# Patient Record
Sex: Female | Born: 1960 | Race: White | Hispanic: No | Marital: Married
Health system: Southern US, Community
[De-identification: ages and names within clinical notes are randomized; demographics above are authoritative.]

## PROBLEM LIST (undated history)

## (undated) DIAGNOSIS — Z8679 Personal history of other diseases of the circulatory system: Secondary | ICD-10-CM

## (undated) DIAGNOSIS — K648 Other hemorrhoids: Secondary | ICD-10-CM

## (undated) DIAGNOSIS — Z9889 Other specified postprocedural states: Secondary | ICD-10-CM

## (undated) DIAGNOSIS — F419 Anxiety disorder, unspecified: Secondary | ICD-10-CM

## (undated) DIAGNOSIS — K219 Gastro-esophageal reflux disease without esophagitis: Secondary | ICD-10-CM

## (undated) DIAGNOSIS — J189 Pneumonia, unspecified organism: Secondary | ICD-10-CM

## (undated) DIAGNOSIS — K5909 Other constipation: Secondary | ICD-10-CM

## (undated) DIAGNOSIS — E162 Hypoglycemia, unspecified: Secondary | ICD-10-CM

## (undated) HISTORY — DX: Gastro-esophageal reflux disease without esophagitis: K21.9

## (undated) HISTORY — DX: Other constipation: K59.09

## (undated) HISTORY — DX: Anxiety disorder, unspecified: F41.9

## (undated) HISTORY — DX: Hypoglycemia, unspecified: E16.2

## (undated) HISTORY — DX: Pneumonia, unspecified organism: J18.9

## (undated) HISTORY — DX: Other hemorrhoids: K64.8

## (undated) HISTORY — PX: KNEE CARTILAGE SURGERY: SHX688

## (undated) HISTORY — DX: Personal history of other diseases of the circulatory system: Z86.79

## (undated) HISTORY — DX: Other specified postprocedural states: Z98.890

---

## 1986-12-25 HISTORY — PX: TUBAL LIGATION: SHX77

## 2000-01-19 ENCOUNTER — Encounter: Payer: Self-pay | Admitting: Family Medicine

## 2000-01-19 ENCOUNTER — Encounter: Admission: RE | Admit: 2000-01-19 | Discharge: 2000-01-19 | Payer: Self-pay | Admitting: Family Medicine

## 2001-04-30 ENCOUNTER — Encounter: Payer: Self-pay | Admitting: Family Medicine

## 2001-04-30 ENCOUNTER — Encounter: Admission: RE | Admit: 2001-04-30 | Discharge: 2001-04-30 | Payer: Self-pay | Admitting: Family Medicine

## 2001-12-25 HISTORY — PX: VAGINAL HYSTERECTOMY: SUR661

## 2002-05-05 ENCOUNTER — Encounter: Admission: RE | Admit: 2002-05-05 | Discharge: 2002-05-05 | Payer: Self-pay | Admitting: Family Medicine

## 2002-05-05 ENCOUNTER — Encounter: Payer: Self-pay | Admitting: Family Medicine

## 2002-06-02 ENCOUNTER — Other Ambulatory Visit: Admission: RE | Admit: 2002-06-02 | Discharge: 2002-06-02 | Payer: Self-pay | Admitting: Obstetrics & Gynecology

## 2002-08-06 ENCOUNTER — Encounter (INDEPENDENT_AMBULATORY_CARE_PROVIDER_SITE_OTHER): Payer: Self-pay | Admitting: Specialist

## 2002-08-06 ENCOUNTER — Observation Stay (HOSPITAL_COMMUNITY): Admission: RE | Admit: 2002-08-06 | Discharge: 2002-08-07 | Payer: Self-pay | Admitting: Obstetrics & Gynecology

## 2003-07-24 ENCOUNTER — Encounter: Admission: RE | Admit: 2003-07-24 | Discharge: 2003-07-24 | Payer: Self-pay | Admitting: Obstetrics & Gynecology

## 2003-07-24 ENCOUNTER — Encounter: Payer: Self-pay | Admitting: Obstetrics & Gynecology

## 2004-05-10 ENCOUNTER — Other Ambulatory Visit: Admission: RE | Admit: 2004-05-10 | Discharge: 2004-05-10 | Payer: Self-pay | Admitting: Obstetrics & Gynecology

## 2004-08-23 ENCOUNTER — Encounter: Admission: RE | Admit: 2004-08-23 | Discharge: 2004-08-23 | Payer: Self-pay | Admitting: Obstetrics & Gynecology

## 2004-09-28 ENCOUNTER — Encounter: Payer: Self-pay | Admitting: Gastroenterology

## 2004-10-04 ENCOUNTER — Encounter: Payer: Self-pay | Admitting: Gastroenterology

## 2005-12-25 HISTORY — PX: ENDOVENOUS ABLATION SAPHENOUS VEIN W/ LASER: SUR449

## 2006-03-02 ENCOUNTER — Encounter: Admission: RE | Admit: 2006-03-02 | Discharge: 2006-03-02 | Payer: Self-pay | Admitting: Obstetrics & Gynecology

## 2010-04-02 ENCOUNTER — Emergency Department (HOSPITAL_COMMUNITY): Admission: EM | Admit: 2010-04-02 | Discharge: 2010-04-02 | Payer: Self-pay | Admitting: Emergency Medicine

## 2010-08-30 ENCOUNTER — Encounter: Payer: Self-pay | Admitting: Gastroenterology

## 2010-09-02 ENCOUNTER — Encounter: Payer: Self-pay | Admitting: Gastroenterology

## 2010-11-22 ENCOUNTER — Telehealth: Payer: Self-pay | Admitting: Gastroenterology

## 2010-11-23 ENCOUNTER — Ambulatory Visit: Payer: Self-pay | Admitting: Gastroenterology

## 2010-11-23 DIAGNOSIS — K5900 Constipation, unspecified: Secondary | ICD-10-CM | POA: Insufficient documentation

## 2010-11-29 ENCOUNTER — Ambulatory Visit: Payer: Self-pay | Admitting: Gastroenterology

## 2010-11-29 LAB — CONVERTED CEMR LAB: Fecal Occult Bld: NEGATIVE

## 2011-01-14 ENCOUNTER — Encounter: Payer: Self-pay | Admitting: Obstetrics & Gynecology

## 2011-01-24 NOTE — Letter (Signed)
Summary: [facility]: Immunoassay Fecal Occult Blood (iFOB) Order [facility]  [address] [place], [place] [address]   Phone: [phone]  Fax: [phone]      [facility]: Immunoassay Fecal Occult Blood (iFOB) Order Form   November 23, 2010 MRN: [id number]   [name] 11/27/1961   Physicican Name: Dr. Claudette Head  Diagnosis Code: 564.00      Christie Nottingham CMA (AAMA)

## 2011-01-24 NOTE — Consult Note (Signed)
Summary: [facility]   Imported By: Sherian Rein 11/24/2010 10:13:31  _____________________________________________________________________  External Attachment:    Type:   Image     Comment:   External Document

## 2011-01-24 NOTE — Progress Notes (Signed)
Summary: Triage  Phone Note Call from Patient Call back at Home Phone [phone]   Caller: Patient Call For: Dr. Russella Dar Reason for Call: Talk to Nurse Summary of Call: Pt has not been here since last colon several years ago but recently she has developed serious constipation issues, she has consulted with primary phy and none of their suggestions seem to help, went off of hormone replacement therapy to see if that would give her relief but nothing seems to help, she is very uncomfortable and woud like to be seen soon if possible Initial call taken by: Raechel Chute,  November 22, 2010 10:58 AM  Follow-up for Phone Call        Patient scheduled to see Dr. Russella Dar 11/23/10 at 10:30am. Patient instructed to call her primary care md, McKenzie, to have records faxed to [phone] attention [facility]. Selinda Michaels, RN Follow-up by: Darcey Nora RN, CGRN,  November 22, 2010 11:11 AM

## 2011-01-24 NOTE — Procedures (Signed)
Summary: Colonoscopy   Colonoscopy  Procedure date:  10/04/2004  Findings:      Location:  [facility].    Procedures Next Due Date:    Colonoscopy: 09/2011 Patient Name: [name], Sarah J. MRN:  Procedure Procedures: Colonoscopy CPT: [phone].  Personnel: Endoscopist: Venita Lick. Russella Dar, MD, Clementeen Graham.  Exam Location: Exam performed in Outpatient Clinic. Outpatient  Patient Consent: Procedure, Alternatives, Risks and Benefits discussed, consent obtained, from patient. Consent was obtained by the RN.  Indications Symptoms: Constipation Hematochezia.  History  Current Medications: Patient is not currently taking Coumadin.  Pre-Exam Physical: Performed Oct 04, 2004. Entire physical exam was normal.  Exam Exam: Extent of exam reached: Cecum, extent intended: Cecum.  The cecum was identified by appendiceal orifice and IC valve. Colon retroflexion performed. ASA Classification: II. Tolerance: excellent.  Monitoring: Pulse and BP monitoring, Oximetry used. Supplemental O2 given.  Colon Prep Used Miralax for colon prep. Prep results: good.  Sedation Meds: Patient assessed and found to be appropriate for moderate (conscious) sedation. Fentanyl 175 mcg. given IV. Versed 17 given IV.  Findings NORMAL EXAM: Cecum to Sigmoid Colon.  HEMORRHOIDS: Internal. Size: Small. Not bleeding. Not thrombosed. ICD9: Hemorrhoids, Internal: 455.0.    Comments: Tortuous colon-moderately difficult procedure Assessment  Diagnoses: 455.0: Hemorrhoids, Internal.   Events  Unplanned Interventions: No intervention was required.  Unplanned Events: There were no complications. Plans Medication Plan: Continue current medications. Hemorrhoidal Medications: Anusol Cream  prn,  Hemorrhoidal Medications: Anusol Suppositories  prn,  Anti-constipation: Miralax BID,   Patient Education: Patient given standard instructions for: Diverticulosis. Hemorrhoids.  Disposition: After procedure  patient sent to recovery. After recovery patient sent home.  Scheduling/Referral: Colonoscopy, to [facility] T. Russella Dar, MD, [facility], around Oct 05, 2011.  Office Visit, to [employer]. Russella Dar, MD, [facility], prn   This report was created from the original endoscopy report, which was reviewed and signed by the above listed endoscopist.

## 2011-01-24 NOTE — Assessment & Plan Note (Signed)
Summary: Constipation/[facility]   History of Present Illness Visit Type: Initial Visit Primary GI MD: Elie Goody MD [facility] Primary Provider: Shary Decamp, MD Chief Complaint: constipation x 6 months History of Present Illness:   This is a 50 year old female, who relates worsening problems with chronic constipation. She underwent colonoscopy in November 2005 for constipation and hematochezia, which revealed a tortuous colon, and internal hemorrhoids. She states in the past year she has had a worsening of her constipation and notes rare episodes of small amounts of rectal bleeding when she strains significantly to have a bowel movement. She uses Ducolax uppositories, MiraLax, enemas and magnesium citrate intermittently for management of her symptoms. She notes bloating and discomfort in her lower abdomen when she has had several days in between bowel movements.   GI Review of Systems    Reports abdominal pain, bloating, and  weight gain.     Location of  Abdominal pain: lower abdomen.    Denies acid reflux, belching, chest pain, dysphagia with liquids, dysphagia with solids, heartburn, loss of appetite, nausea, vomiting, vomiting blood, and  weight loss.      Reports constipation, hemorrhoids, and  rectal bleeding.     Denies anal fissure, black tarry stools, change in bowel habit, diarrhea, diverticulosis, fecal incontinence, heme positive stool, irritable bowel syndrome, jaundice, light color stool, liver problems, and  rectal pain.   Current Medications (verified): 1)  Vivelle-Dot 0.0375 Mg/24hr Pttw (Estradiol) .... As Directed 2)  Ativan 1 Mg Tabs (Lorazepam) .... At Bedtime 3)  Caltrate 600 1500 Mg Tabs (Calcium Carbonate) .... Take 2 Tablets By Mouth Once Daily  Allergies: 1)  ! Sulfa 2)  ! Macrobid 3)  ! Penicillin 4)  ! Codeine 5)  ! Sudafed  Past History:  Past Medical History: Reviewed history from 11/22/2010 and no changes required. 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  Illicit Drug Use - no Alcohol Use - no  Review of Systems       The patient complains of arthritis/joint pain, swelling of feet/legs, thirst - excessive, and urination changes/pain.         The pertinent positives and negatives are noted as above and in the HPI. All other ROS were reviewed and were negative.   Vital Signs:  Patient profile:   50 year old female Height:      66 inches Weight:      140.38 pounds BMI:     22.74 Pulse rate:   68 / minute Pulse rhythm:   regular BP sitting:   100 / 80  (left arm) Cuff size:   regular  Vitals Entered By: June McMurray CMA Duncan Dull) (November 23, 2010 10:34 AM)  Physical Exam  General:  Well developed, well nourished, no acute distress. Head:  Normocephalic and atraumatic. Eyes:  PERRLA, no icterus. Ears:  Normal auditory acuity. Mouth:  No deformity or lesions, dentition normal. Neck:  Supple; no masses or thyromegaly. Lungs:  Clear throughout to auscultation. Heart:  Regular rate and rhythm; no murmurs, rubs,  or bruits. Abdomen:  Soft, nontender and nondistended. No masses, hepatosplenomegaly or hernias noted. Normal bowel sounds. Rectal:  Normal exam. hemocult negative.   Msk:  Symmetrical with no gross deformities. Normal posture. Pulses:  Normal pulses noted. Extremities:  No clubbing, cyanosis, edema or deformities noted. Neurologic:  Alert and  oriented x4;  grossly normal neurologically. Cervical Nodes:  No significant cervical adenopathy. Inguinal Nodes:  No significant inguinal adenopathy. Psych:  Alert and cooperative. Normal mood and  affect.   Impression & Recommendations:  Problem # 1:  CONSTIPATION (ICD-564.00) Chronic constipation, which has worsened over the past year. Advised to increase her daily fiber and water intake. Increase MiraLax to 2 or 3 times daily. Avoid enemas. If her symptoms do not respond to the above measures, home stool occult blood testing is positive or she has persistent problems with hematochezia, we will schedule colonoscopy to further evaluate.  Patient Instructions: 1)  Please go directly to the basement to have your labs drawn.  2)  Start Miralax 17 grams into 8 oz of water 2-3 x daily. 3)  Start Align one capsule by mouth once daily x 1 month and this medication does not help with symptoms then stop.  4)  High Fiber, Low Fat  Healthy Eating Plan brochure given.  5)  Copy sent to : Shary Decamp, MD 6)  The medication list was reviewed and reconciled.  All changed / newly prescribed medications were explained.  A complete medication list was provided to the patient / caregiver.

## 2011-01-27 NOTE — Letter (Signed)
Summary: [facility]   Imported By: [place] 11/28/2010 09:58:17  _____________________________________________________________________  External Attachment:    Type:   Image     Comment:   External Document

## 2011-05-12 NOTE — Op Note (Signed)
NAME:  [name], [name]                           ACCOUNT NO.:  [id number]   MEDICAL RECORD NO.:  [id number]                   PATIENT TYPE:  OBV   LOCATION:  9399                                 FACILITY:  [facility]   PHYSICIAN:  Freddy Finner, M.D.                DATE OF BIRTH:  11/30/1961   DATE OF PROCEDURE:  08/06/2002  DATE OF DISCHARGE:                                 OPERATIVE REPORT   PREOPERATIVE DIAGNOSES:  Fibroids, menorrhagia.   POSTOPERATIVE DIAGNOSES:  Fibroids, menorrhagia.   OPERATIVE PROCEDURE:  Total vaginal hysterectomy.   SURGEON:  Freddy Finner, M.D.   ASSISTANT:  Guy Sandifer. Henderson Cloud, M.D.   ESTIMATED INTRAOPERATIVE BLOOD LOSS:  50 cc.   ANESTHESIA:  General endotracheal.   INTRAOPERATIVE COMPLICATIONS:  None.   INDICATION:  Details of the present illness are recorded in the admission  note.   DESCRIPTION OF PROCEDURE:  The patient was admitted on the morning of  surgery, given a bolus of Cefotan IV, placed in PAS hose, brought to the  operating room, placed under adequate general endotracheal anesthesia,  placed in the dorsal lithotomy position using the Allen stirrup system.  Betadine prep using scrub followed by solution was carried out of lower  abdomen, perineum and vagina.  Sterile drapes were applied.  A posterior  weighted vaginal retractor was placed.  Cervix was grasped with a Christella Hartigan  tenaculum.  Colpotomy incision was made while tenting the mucosa posterior  to the cervix.  Cervix was circumscribed with a scalpel.  Curved Heaneys  were then used to cross-clamp the uterosacral pedicles, which were divided  sharply and ligated with 0 Monocryl in a Heaney fashion.  Bladder was  advanced off the cervix.  Bladder pillars were taken with curved Heaneys,  divided sharply and ligated with 0 Monocryl.  Cardinal ligament pedicles  were taken, divided sharply and ligated with 0 Monocryl.  Anterior  peritoneum was entered.  Vascular pedicle was taken on each  side, divided  sharply and ligated with 0 Monocryl.  A second pedicle was taken above the  vessels on each side; this too was divided sharply and ligated with 0  Monocryl.  Uterus was delivered through the vaginal introitus.  Uterine  artery pedicles were cross-clamped with curved Heaneys, divided sharply and  ligated with free ties of 0 Monocryl, followed by suture ligature of 0  Monocryl.  Careful inspection revealed normal ovaries and tubes.  One Falope  ring was removed; the other was not seen.  These pedicles were released.  Angles of the vagina were anchored to the uterosacrals with mattress sutures  of  0 Monocryl.  Uterosacrals were plicated and posterior peritoneum closed with  0 Monocryl.  Cuff was closed vertically with figure-of-eights of 0 Monocryl.  Hemostasis was complete.  Foley catheter was placed.  Patient was awakened  and taken to recovery in good condition.  Freddy Finner, M.D.    WRN/MEDQ  D:  08/06/2002  T:  08/06/2002  Job:  [id number]

## 2011-05-12 NOTE — H&P (Signed)
NAME:  YOUSRA, [name]                           ACCOUNT NO.:  [id number]   MEDICAL RECORD NO.:  [id number]                   PATIENT TYPE:  AMB   LOCATION:  SDC                                  FACILITY:  [facility]   PHYSICIAN:  Freddy Finner, M.D.                DATE OF BIRTH:  Jan 15, 1961   DATE OF ADMISSION:  08/06/2002  DATE OF DISCHARGE:                                HISTORY & PHYSICAL   ADMISSION DIAGNOSES:  1. Uterine leiomyomata.  2. Menorrhagia.   HISTORY OF PRESENT ILLNESS:  The patient is a 50 year old white married  female, gravida 3, para 3, who presented to the office in June of 2003  complaining of excessive bleeding during menses and passage of very large  clots.  She also had some perimenopausal kinds of symptoms including night  sweats and somewhat irregular menses.  She has a known history of fibroids.  She previously had a benign endometrial biopsy.   On initial examination in the office her uterus was anterior in position and  approximately nine weeks' gestational size.  On further review of systems,  the patient complained of flooding accidents and complained of being  homebound two days each month with her menses.  She has requested definitive  surgical intervention.  She is also requesting that her ovaries be spared if  at all possible.  She is admitted now for a vaginal hysterectomy.   REVIEW OF SYSTEMS:  Otherwise, negative.  There are no cardiopulmonary, GI,  or GU complaints.   FAMILY HISTORY:  Noncontributory.   PAST MEDICAL HISTORY:  The patient has no known significant medical  illnesses.  She does have a history of bronchial asthma which is stable at  the present time with no acute symptoms.   PAST SURGICAL HISTORY:  Tubal ligation done in 1998.   ALLERGIES:  She has allergies to SULFA and MACROBID.   SOCIAL HISTORY:  She does not use cigarettes.   PHYSICAL EXAMINATION:  HEENT:  Grossly within normal limits.  VITAL SIGNS:  Blood pressure in the  office was 104/70.  NECK:  Thyroid gland is not palpably enlarged.  BREAST:  Normal.  No skin changes, no nipple discharge, no palpable masses.  CHEST:  Clear to auscultation.  HEART:  Normal sinus rhythm without murmurs, rubs, or gallops.  ABDOMEN:  Soft and nontender without hepatosplenomegaly or organomegaly or  palpable masses.  EXTREMITIES:  Without cyanosis, clubbing, or edema.  PELVIC:  External genitalia, vagina, and cervix were normal (the patient had  CIN-1 Pap smear and her Pap smear done in May of 2003).  Bimanual:  Uterus is anterior in position, nine weeks' gestational size  (ultrasound did confirm uterine leiomyomata, the largest measuring 3.9 cm  with degeneration and there was a 2.1 cm lesion dividing the endometrial  stripe).  Adnexa:  Free of palpable masses.  Rectovaginal:  Confirms these findings.  IMPRESSION:  1. Uterine leiomyomata with probable submucous and probable degenerating     myoma.  2. Clinical symptoms of severe dysmenorrhea, menorrhagia requiring     restriction of activities with menses.   PLAN:  Total vaginal hysterectomy.  The patient is reviewing a video  recording in the office describing the operative procedure and the potential  risks of the procedure.  She is admitted and prepared to proceed with  surgery.                                               Freddy Finner, M.D.    WRN/MEDQ  D:  08/05/2002  T:  08/05/2002  Job:  [id number]

## 2011-05-12 NOTE — Discharge Summary (Signed)
   NAME:  [name], [name]                           ACCOUNT NO.:  [id number]   MEDICAL RECORD NO.:  [id number]                   PATIENT TYPE:  OBV   LOCATION:  9319                                 [facility]ACILITY:  [facility]   PHYSICIAN:  Naryah Clenney DICTATOR                    DATE OF BIRTH:  [birth year]   DATE OF ADMISSION:  08/06/2002  DATE OF DISCHARGE:  08/07/2002                                 DISCHARGE SUMMARY   DISCHARGE DIAGNOSES:  1. Uterine fibroids with one submucous fibroid, one degenerating fibroid.  2. Menorrhagia, uncontrolled oral contraceptives.   OPERATIVE PROCEDURE:  Total vaginal hysterectomy.   INTRAOPERATIVE AND POSTOPERATIVE COMPLICATIONS:  None.   DISPOSITION:  The patient is in satisfactory and improved condition on the  afternoon of her first postoperative day.  She is ambulating without  difficulty, having adequate bowel and bladder function, tolerating a regular  diet.  She is given Percocet as needed for postoperative pain, and she mix  with ibuprofen.  She is to return to the office in 10 days for follow-up.  She is to take a regular diet.  She is to use a stool softener of choice, a  mild laxative of choice.  She is to avoid heavy lifting or vaginal entry.  She is to report fever or heavy bleeding.   Details of the present illness, past history, family history, review of  systems, and physical exam report in the admission note and/or in the  operative summary.  The physical findings on admission were remarkable only  for pelvic findings of severe irregular enlargement of the uterus to  approximately nine weeks gestational size with firm nodules present.   LABORATORY DATA:  During this admission incudes admission hemoglobin of  13.4, normal urinalysis on admission.  Postoperative hemoglobin was 12.  Admission prothrombin time INR, PTT were all normal.   HOSPITAL COURSE:  The patient was admitted on the morning of surgery and  treated perioperatively with PAS hose  with the antibiotic.  The above-  described procedure was accomplished without significant difficulty without  significant intraoperative blood loss.  The patient's postoperative course  was without complication.  She was discharged with disposition as noted  above.                                               [name] DICTATOR    DD/MEDQ  D:  08/07/2002  T:  08/07/2002  Job:  [id number]

## 2011-05-24 ENCOUNTER — Encounter: Payer: Self-pay | Admitting: Gastroenterology

## 2011-05-24 ENCOUNTER — Ambulatory Visit (INDEPENDENT_AMBULATORY_CARE_PROVIDER_SITE_OTHER): Payer: BC Managed Care – PPO | Admitting: Gastroenterology

## 2011-05-24 VITALS — BP 120/64 | HR 76 | Ht 66.0 in | Wt 140.0 lb

## 2011-05-24 DIAGNOSIS — K5900 Constipation, unspecified: Secondary | ICD-10-CM

## 2011-05-24 MED ORDER — PEG-KCL-NACL-NASULF-NA ASC-C 100 G PO SOLR: 1.0000 | Freq: Once | ORAL | Status: AC

## 2011-05-24 NOTE — Progress Notes (Signed)
History of Present Illness: This is a  50 year old female who has chronic constipation. She states she takes magnesium citrate about once every 2 weeks and then her bowels were moving fairly regularly for about 5-7 days and then she drove for about a week and agrees magnesium citrate. MiraLax was not been effective for her so she discontinued it. Stool iFOB was negative. Prior colonoscopy in 2005 showed a tortuous colon and hemorrhoids.   Current Medications, Allergies, Past Medical History, Past Surgical History, Family History and Social History were reviewed in [employer] record.  Physical Exam: General: Well developed , well nourished, no acute distress Head: Normocephalic and atraumatic Eyes:  sclerae anicteric, EOMI Ears: Normal auditory acuity Mouth: No deformity or lesions Lungs: Clear throughout to auscultation Heart: Regular rate and rhythm; no murmurs, rubs or bruits Abdomen: Soft, non tender and non distended. No masses, hepatosplenomegaly or hernias noted. Normal Bowel sounds Rectal: Deferred to colonoscopy Musculoskeletal: Symmetrical with no gross deformities  Pulses:  Normal pulses noted Extremities: No clubbing, cyanosis, edema or deformities noted Neurological: Alert oriented x 4, grossly nonfocal Psychological:  Alert and cooperative. Normal mood and affect  Assessment and Recommendations:  1. Chronic constipation. Try magnesium oxide 1 to 3 tablets each day and use magnesium citrate every few weeks for refractory constipation. Rule out colorectal lesions exacerbating her constipation. Schedule colonoscopy. The risks, benefits, and alternatives to colonoscopy with possible biopsy and possible polypectomy were discussed with the patient and they consent to proceed.

## 2011-05-24 NOTE — Patient Instructions (Signed)
You have been scheduled for a Colonoscopy. Separate instructions given. Use Magnesium oxide 1-4 tablets by mouth every day titrated depending on your bowel movements.

## 2011-05-25 ENCOUNTER — Encounter: Payer: Self-pay | Admitting: Gastroenterology

## 2011-06-22 ENCOUNTER — Ambulatory Visit (AMBULATORY_SURGERY_CENTER): Payer: BC Managed Care – PPO | Admitting: Gastroenterology

## 2011-06-22 ENCOUNTER — Encounter: Payer: Self-pay | Admitting: Gastroenterology

## 2011-06-22 VITALS — BP 110/59 | HR 69 | Temp 98.1°F | Resp 16 | Ht 66.0 in | Wt 140.0 lb

## 2011-06-22 DIAGNOSIS — K5900 Constipation, unspecified: Secondary | ICD-10-CM

## 2011-06-22 DIAGNOSIS — K648 Other hemorrhoids: Secondary | ICD-10-CM

## 2011-06-22 MED ORDER — SODIUM CHLORIDE 0.9 % IV SOLN: 500.0000 mL | INTRAVENOUS | Status: DC

## 2011-06-22 NOTE — Patient Instructions (Signed)
FOLLOW DISCHARGE INSTRUCTIONS  INFORMATION ON HIGH FIBER DIET, CONSTIPATION, AND HEMORRHOIDS GIVEN

## 2011-06-23 ENCOUNTER — Telehealth: Payer: Self-pay

## 2011-06-23 NOTE — Telephone Encounter (Signed)

## 2012-06-12 ENCOUNTER — Other Ambulatory Visit (HOSPITAL_COMMUNITY): Payer: Self-pay | Admitting: Obstetrics & Gynecology

## 2012-06-12 DIAGNOSIS — R102 Pelvic and perineal pain: Secondary | ICD-10-CM

## 2012-06-12 DIAGNOSIS — M549 Dorsalgia, unspecified: Secondary | ICD-10-CM

## 2012-06-13 ENCOUNTER — Ambulatory Visit (HOSPITAL_COMMUNITY)
Admission: RE | Admit: 2012-06-13 | Discharge: 2012-06-13 | Disposition: A | Discharge status: Home / Self Care | Payer: BC Managed Care – PPO | Source: Ambulatory Visit | Attending: Obstetrics & Gynecology | Admitting: Obstetrics & Gynecology

## 2012-06-13 DIAGNOSIS — Z90710 Acquired absence of both cervix and uterus: Secondary | ICD-10-CM | POA: Insufficient documentation

## 2012-06-13 DIAGNOSIS — N949 Unspecified condition associated with female genital organs and menstrual cycle: Secondary | ICD-10-CM | POA: Insufficient documentation

## 2012-06-13 DIAGNOSIS — M549 Dorsalgia, unspecified: Secondary | ICD-10-CM

## 2012-06-13 DIAGNOSIS — K7689 Other specified diseases of liver: Secondary | ICD-10-CM | POA: Insufficient documentation

## 2012-06-13 DIAGNOSIS — R102 Pelvic and perineal pain: Secondary | ICD-10-CM

## 2012-06-13 MED ORDER — IOHEXOL 300 MG/ML  SOLN: 100.0000 mL | Freq: Once | INTRAMUSCULAR | Status: AC | PRN

## 2012-06-14 ENCOUNTER — Ambulatory Visit: Payer: BC Managed Care – PPO

## 2012-06-14 ENCOUNTER — Encounter: Payer: Self-pay | Admitting: Physician Assistant

## 2012-06-14 ENCOUNTER — Telehealth: Payer: Self-pay | Admitting: Physician Assistant

## 2012-06-14 ENCOUNTER — Ambulatory Visit (INDEPENDENT_AMBULATORY_CARE_PROVIDER_SITE_OTHER): Payer: BC Managed Care – PPO | Admitting: Physician Assistant

## 2012-06-14 ENCOUNTER — Telehealth: Payer: Self-pay | Admitting: Gastroenterology

## 2012-06-14 VITALS — BP 90/70 | HR 80 | Ht 66.0 in | Wt 146.5 lb

## 2012-06-14 DIAGNOSIS — R1084 Generalized abdominal pain: Secondary | ICD-10-CM

## 2012-06-14 DIAGNOSIS — K5900 Constipation, unspecified: Secondary | ICD-10-CM

## 2012-06-14 DIAGNOSIS — R635 Abnormal weight gain: Secondary | ICD-10-CM

## 2012-06-14 LAB — COMPREHENSIVE METABOLIC PANEL
AST: 24 U/L (ref 0–37)
Albumin: 4.4 g/dL (ref 3.5–5.2)
Alkaline Phosphatase: 56 U/L (ref 39–117)
BUN: 21 mg/dL (ref 6–23)
Potassium: 5.3 mEq/L — ABNORMAL HIGH (ref 3.5–5.1)
Sodium: 141 mEq/L (ref 135–145)

## 2012-06-14 MED ORDER — LINACLOTIDE 145 MCG PO CAPS: 1.0000 | ORAL_CAPSULE | Freq: Every day | ORAL | Status: DC

## 2012-06-14 NOTE — Patient Instructions (Addendum)
Please go to the basement level to have your labs drawn.   This weekend use Suprep to purge bowels. Follow instructions on the box for the prep. We have given you samples of Linzess 145 mcg capsules. Take 1 daily, if you find the Linzess is helping call us for a prescription.  Follow up with Dr. Russella Dar the end of July.

## 2012-06-14 NOTE — Progress Notes (Signed)
Subjective:    Patient ID: [name], female    DOB: 03-30-1961, 51 y.o.   MRN: [id number]  HPI [name] is a pleasant 51 year old white female known to Dr. Russella Dar who underwent colonoscopy for screening in June of 2012 this was a normal exam with internal hemorrhoids noted. She is referred back today by Dr. Moshe Cipro for evaluation of complaint of abdominal distention bloating discomfort and constipation. She says she's had her current symptoms over the past couple of months but that it has gotten much worse over the past week and a half. She describes it as a pressure type pain in her lower abdomen. She says she does have a history of chronic constipation but had weaned herself off of all laxatives over a year ago and had been doing well drinking prune juice on a daily basis however now over the past several weeks she has had increased problems with constipation. She says she may have a bowel movement several times a day but will just pass a very small piece of stool. There are  no complaints of melena or hematochezia. Her appetite has been fine, her weight is stable, and has actually gone up. She tried taking Gas-X which was not beneficial and more recently has been using  ibuprofen which helps with the discomfort but she says it is  making her more constipated. A recent labs show a WBC of 4.7 hemoglobin 14.4 hematocrit 41.4 MCV of 88.   CT scan of the abdomen and pelvis was done on 06/13/2012 which showed a very small amount of paracardial fluid and/or thickening inferiorly, multiple well-defined low attenuation lesions in the liver compatible with cysts the largest measuring 3.3 cm was also a 2.1 x 1.2 cm lesion felt consistent with a hemangioma, possibly one tiny gallstone. Ovaries are felt to be within normal limits, no  evidence of ascites or pathologic distention of the bowel, no lymphadenopathy. She did have a moderate volume of formed stool throughout the colon.    Review of Systems    Constitutional: Positive for unexpected weight change.  HENT: Negative.   Eyes: Negative.   Respiratory: Negative.   Cardiovascular: Negative.   Gastrointestinal: Positive for abdominal pain, constipation and abdominal distention.  Genitourinary: Negative.   Musculoskeletal: Negative.   Neurological: Negative.   Hematological: Negative.   Psychiatric/Behavioral: Negative.    Outpatient Prescriptions Prior to Visit  Medication Sig Dispense Refill  . Calcium Carbonate (CALTRATE 600 PO) Take 2 tablets by mouth daily.        Marland Kitchen estradiol (VIVELLE-DOT) 0.0375 MG/24HR Place 1 patch onto the skin as directed.        Marland Kitchen LORazepam (ATIVAN) 1 MG tablet Take 1 mg by mouth at bedtime as needed.        . magnesium citrate solution Take 296 mLs by mouth. Once every two weeks        Facility-Administered Medications Prior to Visit  Medication Dose Route Frequency Provider Last Rate Last Dose  . 0.9 %  sodium chloride infusion  500 mL Intravenous Continuous Meryl Dare, MD,FACG           Allergies  Allergen Reactions  . Ciprofloxacin Hcl   . Codeine   . Nitrofurantoin   . Penicillins   . Pseudoephedrine   . Sulfonamide Derivatives    Patient Active Problem List  Diagnosis  . CONSTIPATION   History   Social History  . Marital Status: Married    Spouse Name: N/A    Number  of Children: N/A  . Years of Education: N/A   Occupational History  . Not on file.   Social History Main Topics  . Smoking status: Never Smoker   . Smokeless tobacco: Never Used  . Alcohol Use: No  . Drug Use: No  . Sexually Active: Not on file   Other Topics Concern  . Not on file   Social History Narrative  . No narrative on file    Objective:   Physical Exam well-developed white female in no acute distress, pleasant blood pressure 90/70 pulse 80 height 5 foot 6 weight 146. HEENT; nontraumatic normocephalic EOMI PERRLA sclera anicteric conjunctiva pink,Neck; Supple no JVD, Cardiovascular; regular  rate and rhythm with S1-S2 no murmur or gallop, Pulmonary; clear bilaterally, Abdomen ;soft she has mild bilateral lower quadrant tenderness and midabdominal tenderness there is no guarding no rebound no palpable mass or hepatosplenomegaly bowel sounds are active, Rectal; exam minimal stool in the rectal vault no impaction Hemoccult-negative, Extremities; no clubbing, cyanosis or edema skin warm and dry, Psych; mood and affect normal and appropriate.        Assessment & Plan:   #31 51 year old female with history of constipation and normal colonoscopy 2012, now presenting with several week history of lower abdominal pressure bloating sensation of distention and increased constipation. CT scan done yesterday shows no evidence of intra-abdominal pathology to explain her symptoms other than a colon full of stool. I suspect her symptoms are secondary to significant constipation. #2 hepatic cysts, and one hepatic hemangioma #3 possible tiny gallstone #4 incidental finding of small amount of pericardial fluid on CT-patient has appointment with Dr. Wall/cardiology pending  Plan; check thyroid panel, CMET today Purge bowel with a Suprep, and if this alleviates her symptoms we'll start on a trial of Linzess 145 mcg by mouth daily, samples were given today. Plan followup with Dr. Russella Dar in 3-4 weeks

## 2012-06-14 NOTE — Telephone Encounter (Signed)
Schedule with [name] at 2pm (per [facility]) Patient aware.

## 2012-06-15 LAB — THYROID PANEL WITH TSH: T3 Uptake: 30.6 % (ref 22.5–37.0)

## 2012-06-17 NOTE — Progress Notes (Signed)
Reviewed and agree with management. Janeann Paisley D. Aidel Davisson, M.D., FACG  

## 2012-06-17 NOTE — Telephone Encounter (Signed)
Left a message for patient to call me. 

## 2012-06-18 NOTE — Telephone Encounter (Signed)
Left a message for patient to call me. 

## 2012-06-19 NOTE — Telephone Encounter (Signed)
Unable to reach patient.

## 2012-06-21 ENCOUNTER — Telehealth: Payer: Self-pay | Admitting: Physician Assistant

## 2012-06-21 NOTE — Telephone Encounter (Signed)
Patient states the Suprep cleaned her out good. She has had one bowel movement since starting the Linzess. She will call with update on Monday.

## 2012-06-26 ENCOUNTER — Telehealth: Payer: Self-pay | Admitting: *Deleted

## 2012-06-28 ENCOUNTER — Telehealth: Payer: Self-pay | Admitting: *Deleted

## 2012-06-28 NOTE — Telephone Encounter (Signed)
The patient did use the Suprep right after she saw Amy and then took the Linzess daily for 1-2 weeks and she hasnt had a BM for days .  She has had to use a few fleet enemas.  She is out of the Linzess 145 mcg .  I told her over the weekend to take Miralax Am and PM and come here Monday and pick up samples of the higher dosage of Linzess.  I told her to call me then after a week and let me know how she is doing.  She thanked me for calling her back.l

## 2012-07-01 NOTE — Telephone Encounter (Signed)
See phone note from 06/28/12

## 2012-07-08 ENCOUNTER — Encounter: Payer: Self-pay | Admitting: *Deleted

## 2012-07-09 ENCOUNTER — Ambulatory Visit (INDEPENDENT_AMBULATORY_CARE_PROVIDER_SITE_OTHER): Payer: BC Managed Care – PPO | Admitting: Cardiology

## 2012-07-09 ENCOUNTER — Encounter: Payer: Self-pay | Admitting: Cardiology

## 2012-07-09 VITALS — BP 86/54 | HR 82 | Ht 66.0 in | Wt 147.0 lb

## 2012-07-09 DIAGNOSIS — I313 Pericardial effusion (noninflammatory): Secondary | ICD-10-CM | POA: Insufficient documentation

## 2012-07-09 DIAGNOSIS — I319 Disease of pericardium, unspecified: Secondary | ICD-10-CM

## 2012-07-09 NOTE — Patient Instructions (Addendum)
Your physician has requested that you have an echocardiogram in the next 3 months. Echocardiography is a painless test that uses sound waves to create images of your heart. It provides your doctor with information about the size and shape of your heart and how well your heart's chambers and valves are working. This procedure takes approximately one hour. There are no restrictions for this procedure.  Your physician wants you to follow-up in: 4 months. You will receive a reminder letter in the mail two months in advance. If you don't receive a letter, please call our office to schedule the follow-up appointment.

## 2012-07-09 NOTE — Progress Notes (Signed)
HPI [name] is a 51 year old white female referred today by Dr. Konrad Hughes for a pericardial effusion.  She is being evaluated for abdominal pelvic and back pain. A CT scan of the abdomen and pelvis demonstrated no acute abnormality only moderate constipation. There was however noted a very small amount of pericardial fluid and/or thickening inferiorly. Was felt not to be hemodynamically significant. She was referred for further evaluation.  She denies any chest pain, pleuritic or otherwise. She's had no cough. She denies any shortness of breath. Her biggest complaint is generalized fatigue which has not improved since she had the flu in December. At that time she describes fever, head congestion, cough, aching. It took her quite a while to get over it. She does not recall having any pleuritic or other chest pain at that time.  There is no history of chest trauma. She's had no previous cardiac history. Her weight has been stable. Other than fatigue, she currently has no systemic symptoms.  Past Medical History  Diagnosis Date  . Internal hemorrhoids   . Uterine leiomyoma   . Allergic rhinitis   . Hypoglycemia   . Asthma   . Chronic constipation   . Anxiety disorder     Current Outpatient Prescriptions  Medication Sig Dispense Refill  . Calcium Carbonate (CALTRATE 600 PO) Take 2 tablets by mouth daily.        . Estradiol (VAGIFEM) 10 MCG TABS Place 1 tablet vaginally 2 (two) times a week.      . estradiol (VIVELLE-DOT) 0.0375 MG/24HR Place 1 patch onto the skin as directed.        . Linaclotide (LINZESS) 145 MCG CAPS Take 1 capsule by mouth daily.  15 capsule  0  . LORazepam (ATIVAN) 1 MG tablet Take 1 mg by mouth at bedtime as needed.         Current Facility-Administered Medications  Medication Dose Route Frequency Provider Last Rate Last Dose  . DISCONTD: 0.9 %  sodium chloride infusion  500 mL Intravenous Continuous Meryl Dare, MD,FACG        Allergies  Allergen  Reactions  . Ciprofloxacin Hcl   . Codeine   . Nitrofurantoin   . Penicillins   . Pseudoephedrine   . Sulfonamide Derivatives     Family History  Problem Relation Age of Onset  . Stomach cancer      Aunt  . Colitis Sister     crohns    History   Social History  . Marital Status: Married    Spouse Name: N/A    Number of Children: N/A  . Years of Education: N/A   Occupational History  . Not on file.   Social History Main Topics  . Smoking status: Never Smoker   . Smokeless tobacco: Never Used  . Alcohol Use: No  . Drug Use: No  . Sexually Active: Not on file   Other Topics Concern  . Not on file   Social History Narrative  . No narrative on file    ROS ALL NEGATIVE EXCEPT THOSE NOTED IN HPI  PE  General Appearance: well developed, well nourished in no acute distress HEENT: symmetrical face, PERRLA, good dentition  Neck: no JVD, thyromegaly, or adenopathy, trachea midline Chest: symmetric without deformity Cardiac: PMI non-displaced, RRR, normal S1, S2, no gallop or murmur, no rub Lung: clear to ausculation and percussion Vascular: all pulses full without bruits  Abdominal: nondistended, nontender, good bowel sounds, no HSM, no bruits Extremities: no  cyanosis, clubbing or edema, no sign of DVT, no varicosities  Skin: normal color, no rashes Neuro: alert and oriented x 3, non-focal Pysch: normal affect  EKG Normal sinus rhythm, minimal rightward axis, no ST segment changes, no PR depression, no evidence of active pericarditis. BMET    Component Value Date/Time   NA 141 06/14/2012 1514   K 5.3* 06/14/2012 1514   CL 105 06/14/2012 1514   CO2 27 06/14/2012 1514   GLUCOSE 82 06/14/2012 1514   BUN 21 06/14/2012 1514   CREATININE 0.8 06/14/2012 1514   CALCIUM 10.1 06/14/2012 1514    Lipid Panel  No results found for this basename: chol, trig, hdl, cholhdl, vldl, ldlcalc    CBC No results found for this basename: wbc, rbc, hgb, hct, plt, mcv, mch, mchc,  rdw, neutrabs, lymphsabs, monoabs, eosabs, basosabs

## 2012-07-09 NOTE — Assessment & Plan Note (Signed)
This is probably a sequelae of the flu syndrome she had in December of 2012. If so, it probably is slowly resolving. She is totally asymptomatic. Her exam is without rub or any sign of hemodynamic compromise. Her EKG is normal.  I've reassured her today that this will likely go away without any further issues. It is essentially a serendipity by way of a abdominal CT scan.  I have scheduled her for an echocardiogram in 3 months to see if this totally resolved.  If she develops edema, shortness of breath, or any kind of chest pain she is to let us know. I suspect this will be of no significant clinical consequence.

## 2012-07-17 ENCOUNTER — Ambulatory Visit (HOSPITAL_COMMUNITY): Payer: BC Managed Care – PPO | Attending: Internal Medicine | Admitting: Radiology

## 2012-07-17 DIAGNOSIS — I319 Disease of pericardium, unspecified: Secondary | ICD-10-CM | POA: Insufficient documentation

## 2012-07-17 DIAGNOSIS — I313 Pericardial effusion (noninflammatory): Secondary | ICD-10-CM

## 2012-07-17 DIAGNOSIS — I059 Rheumatic mitral valve disease, unspecified: Secondary | ICD-10-CM | POA: Insufficient documentation

## 2012-07-17 NOTE — Progress Notes (Signed)
Echocardiogram performed.  

## 2012-10-07 ENCOUNTER — Ambulatory Visit (HOSPITAL_COMMUNITY): Payer: BC Managed Care – PPO | Attending: Internal Medicine | Admitting: Radiology

## 2012-10-07 ENCOUNTER — Other Ambulatory Visit (HOSPITAL_COMMUNITY): Payer: Self-pay | Admitting: Radiology

## 2012-10-07 DIAGNOSIS — I313 Pericardial effusion (noninflammatory): Secondary | ICD-10-CM

## 2012-10-07 DIAGNOSIS — I059 Rheumatic mitral valve disease, unspecified: Secondary | ICD-10-CM | POA: Insufficient documentation

## 2012-10-07 DIAGNOSIS — I319 Disease of pericardium, unspecified: Secondary | ICD-10-CM | POA: Insufficient documentation

## 2012-10-07 DIAGNOSIS — I369 Nonrheumatic tricuspid valve disorder, unspecified: Secondary | ICD-10-CM | POA: Insufficient documentation

## 2012-10-07 NOTE — Progress Notes (Signed)
Echocardiogram performed.  

## 2012-10-17 ENCOUNTER — Other Ambulatory Visit (HOSPITAL_COMMUNITY): Payer: BC Managed Care – PPO

## 2013-06-17 IMAGING — CT CT ABD-PELV W/ CM
1 of 3 series · 13 of 32 positions shown, 18 images · IV contrast (OMNIPAQUE)
Comparison: No priors.

CLINICAL DATA: Pelvic pain and back pain.

CT ABDOMEN AND PELVIS WITH CONTRAST
TECHNIQUE: Multidetector CT imaging of the abdomen and pelvis was
performed following the standard protocol during bolus
administration of intravenous contrast.
Contrast:  100 ml of 7mnipaque-T33.

[Series 2: routine abdomen/pelvis with · axial · 0.73mm/px · z∈[-510,-110]mm · 13 of 92 slices shown, 18 images]
[im 6/92  soft-tissue]
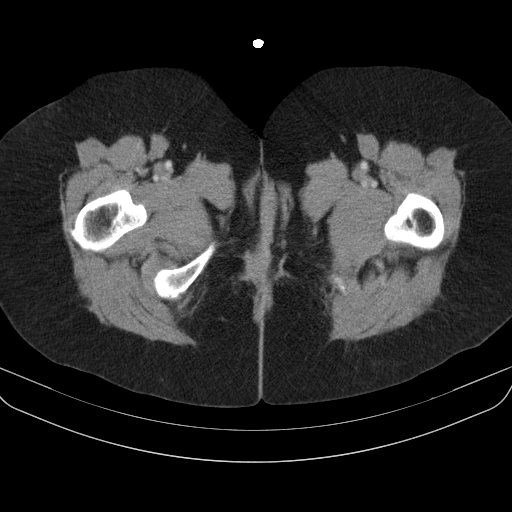
[im 6/92  bone]
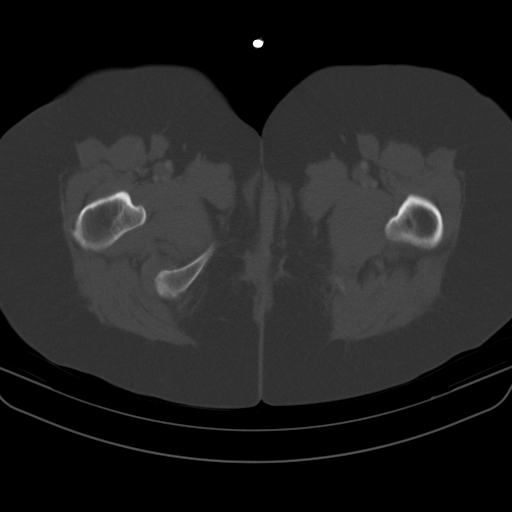
[im 17/92  soft-tissue]
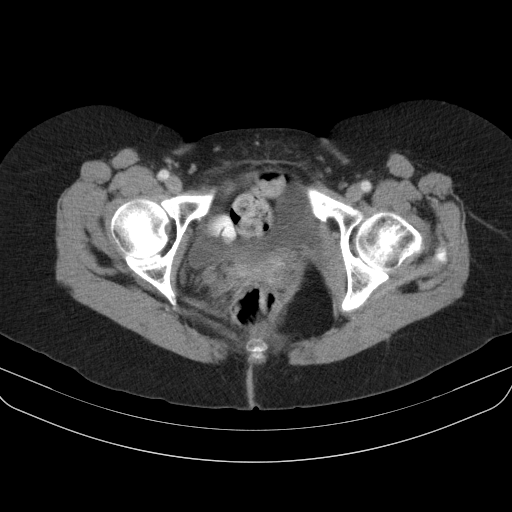
[im 22/92  soft-tissue]
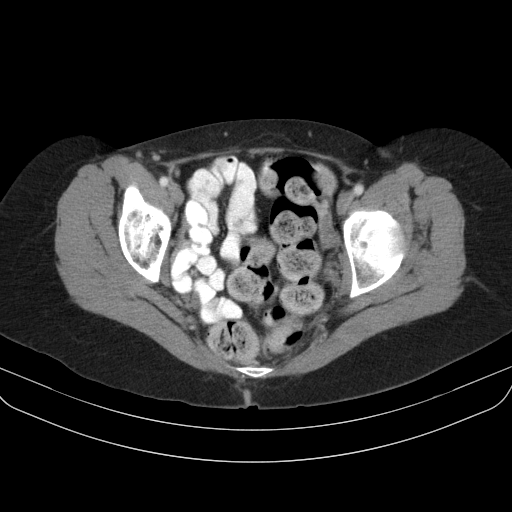
[im 27/92  soft-tissue]
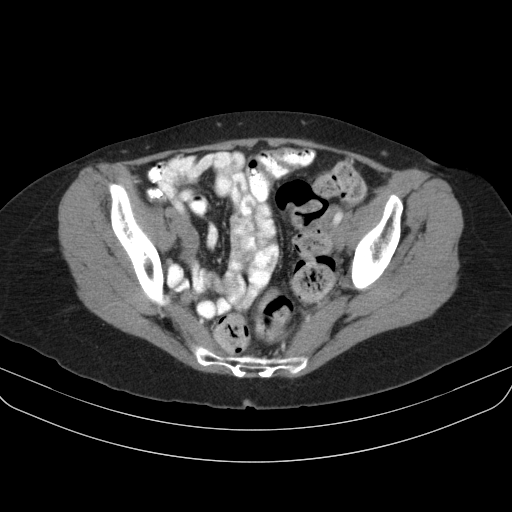
[im 38/92  soft-tissue]
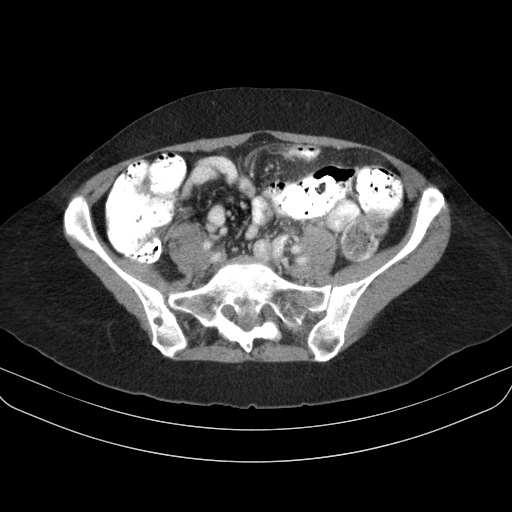
[im 43/92  soft-tissue]
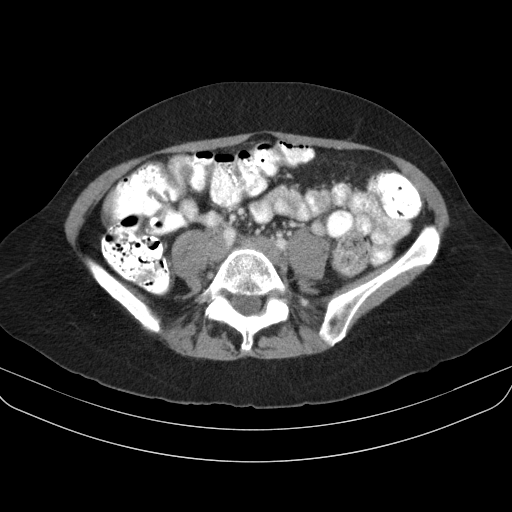
[im 49/92  soft-tissue]
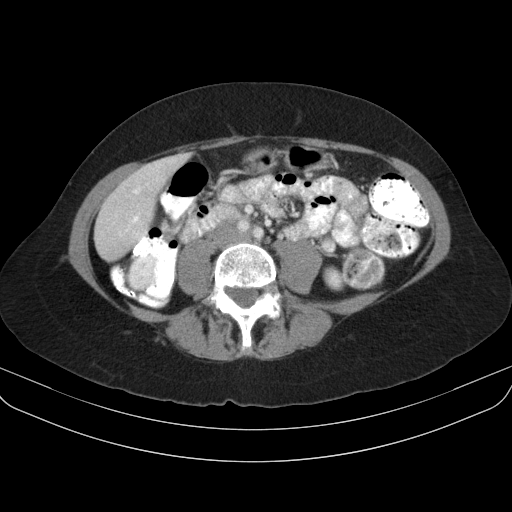
[im 59/92  soft-tissue]
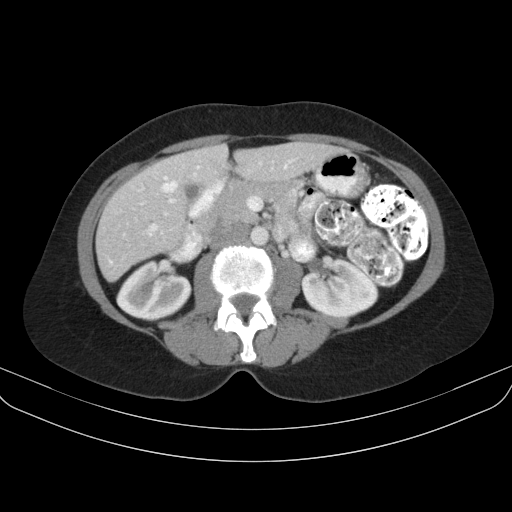
[im 65/92  soft-tissue]
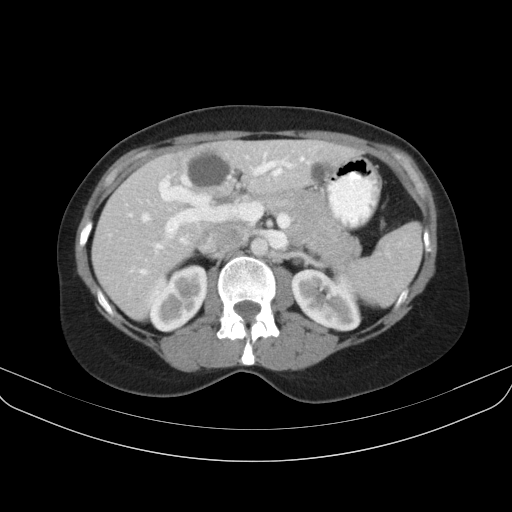
[im 65/92  bone]
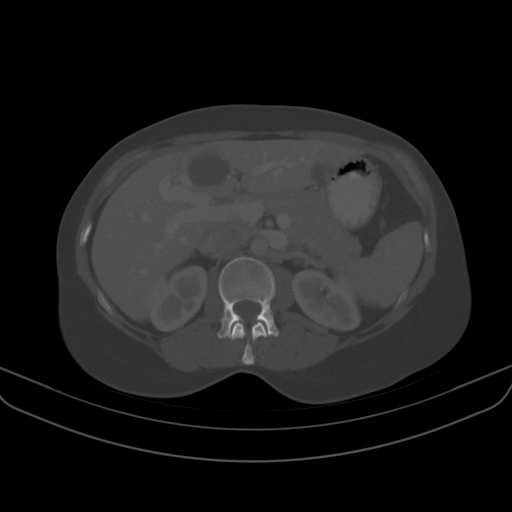
[im 70/92  soft-tissue]
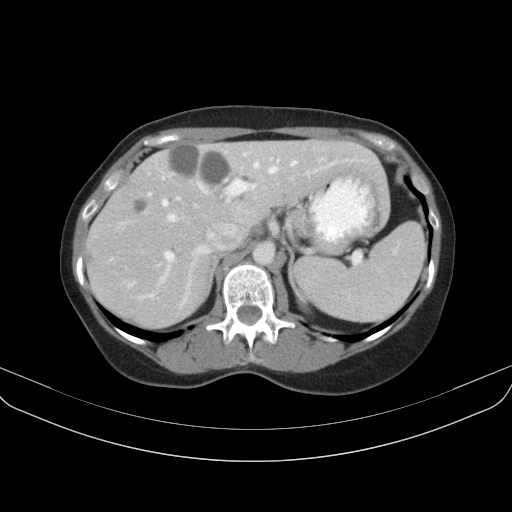
[im 70/92  lung]
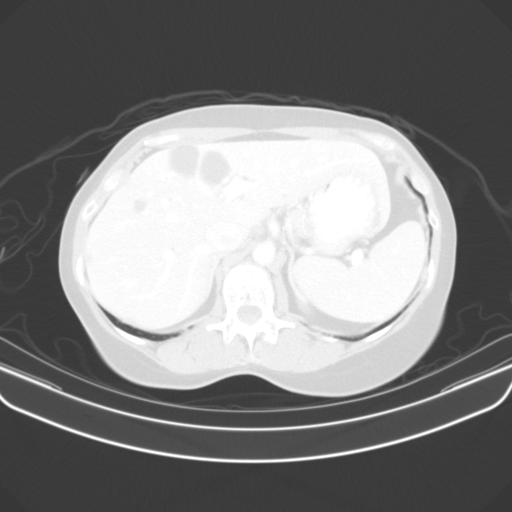
[im 75/92  lung]
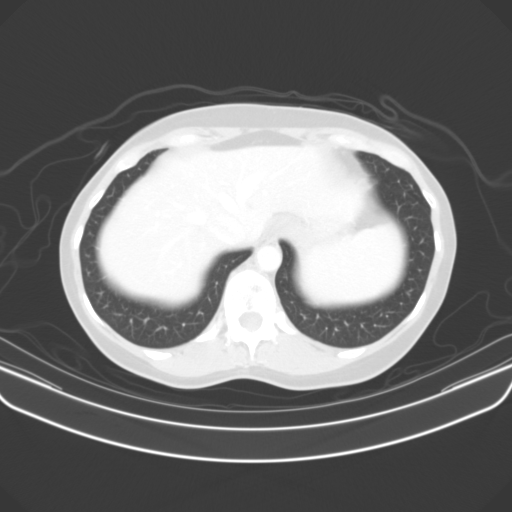
[im 81/92  soft-tissue]
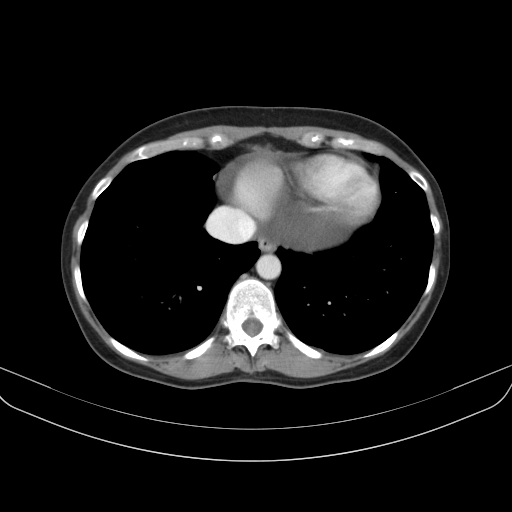
[im 81/92  lung]
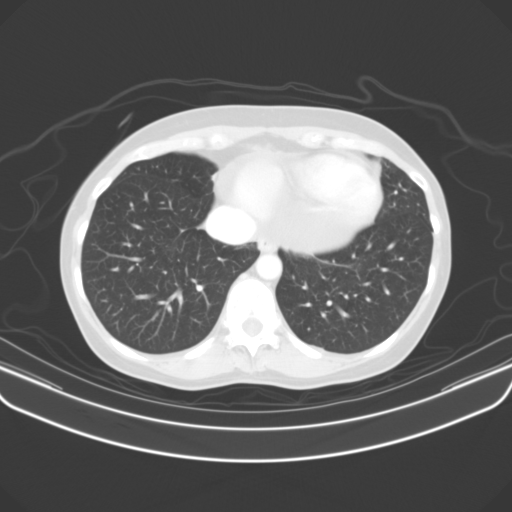
[im 86/92  soft-tissue]
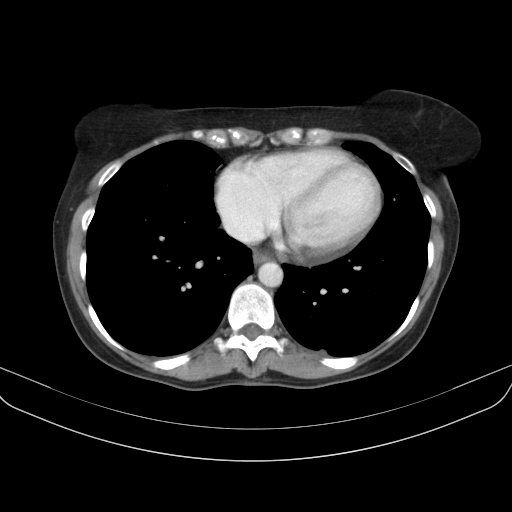
[im 86/92  lung]
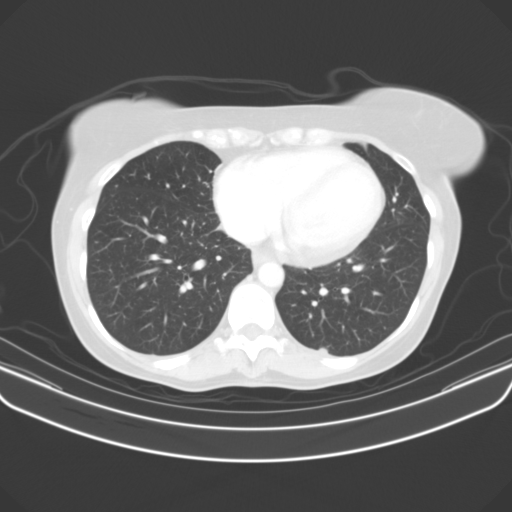

[13 of 32 positions shown; findings below may reference images not displayed]

FINDINGS: Lung Bases: Small amount of pericardial fluid and/or thickening
inferiorly.  Otherwise unremarkable.

Abdomen/Pelvis:  There are multiple well defined low attenuation
lesions in the liver, compatible with cysts, the largest of which
is in segment 4B measuring approximately 3.3 cm in diameter.
Immediately anterior and inferior to this within segment 4B there
is a well-defined 2.1 x 1.2 cm lesion that is centrally low
attenuation and demonstrates some early peripheral nodular
enhancement, subsequently becoming equivalent in attenuation to
blood pool on delayed imaging, compatible with a hemangioma.  Tiny
focus of slightly increased attenuation in the gallbladder may
represent a tiny gallstone.  No signs to suggest acute
cholecystitis at this time.  Enhanced appearance of the pancreas,
spleen, bilateral adrenal glands and right kidney is unremarkable.
Subcentimeter low attenuation lesion in the lower pole left kidney
is too small to definitively characterize.

The patient is status post total abdominal hysterectomy.  The
bilateral ovaries appear within normal limits.  Urinary bladder is
nearly completely decompressed, but otherwise unremarkable.  No
ascites or pneumoperitoneum and no pathologic distension of bowel.
No definite pathologic lymphadenopathy identified within the
abdomen or pelvis.  Moderate volume of well formed stool throughout
the colon could suggest constipation.  The appendix is not clearly
identified and may be surgically absent.  However, there are no
inflammatory regions adjacent to the cecum to suggest an acute
appendicitis at this time.

Musculoskeletal: There are no aggressive appearing lytic or blastic
lesions noted in the visualized portions of the skeleton.
IMPRESSION: 1.  No acute findings in the abdomen or pelvis to account for the
patient's symptoms.
2.  However, there is a moderate volume of well formed stool
throughout the distal colon and rectum, which could suggest
constipation.  Clinical correlation is recommended.
3.  Additionally, there is a small amount of pericardial fluid
and/or thickening inferiorly.  This is unlikely to be of
hemodynamic significance at this time, however, clinical
correlation for signs and symptoms of pericarditis may be
warranted.
4.  Numerous hepatic cysts, as above.
5.  2.1 x 1.2 cm cavernous hemangioma in segment 4B of the liver
incidentally noted.
6.  Subcentimeter low attenuation lesion in the lower pole of the
left kidney is too small to definitively characterize, but is
statistically likely to represent a cyst.

## 2014-08-07 ENCOUNTER — Other Ambulatory Visit: Payer: Self-pay | Admitting: Orthopaedic Surgery

## 2014-08-07 DIAGNOSIS — M25562 Pain in left knee: Secondary | ICD-10-CM

## 2014-08-11 ENCOUNTER — Ambulatory Visit
Admission: RE | Admit: 2014-08-11 | Discharge: 2014-08-11 | Disposition: A | Discharge status: Home / Self Care | Payer: BC Managed Care – PPO | Source: Ambulatory Visit | Attending: Orthopaedic Surgery | Admitting: Orthopaedic Surgery

## 2014-08-11 DIAGNOSIS — M25562 Pain in left knee: Secondary | ICD-10-CM

## 2014-11-05 ENCOUNTER — Other Ambulatory Visit: Payer: Self-pay | Admitting: Obstetrics & Gynecology

## 2014-11-06 LAB — CYTOLOGY - PAP

## 2015-03-25 ENCOUNTER — Telehealth (HOSPITAL_COMMUNITY): Payer: Self-pay | Admitting: *Deleted

## 2015-07-28 ENCOUNTER — Encounter: Payer: Self-pay | Admitting: Gastroenterology

## 2017-02-13 ENCOUNTER — Encounter: Payer: Self-pay | Admitting: Cardiology

## 2017-02-16 ENCOUNTER — Other Ambulatory Visit: Payer: Self-pay | Admitting: Physician Assistant

## 2017-02-16 NOTE — Progress Notes (Signed)
Paged by patient, Dr. York Spanielilley's patient, apparently Dr. Donnie Ahoilley gave her a Rx for azithromycin today, but it is not in her pharmacy. I have called [facility], [facility] and [facility], none has her Rx. I was able to get in touch with Dr. Donnie Ahoilley, apparently he had issue with electronic Rx earlier today, problem has been fixed, patient informed to pick Rx at [facility].  Ramond DialSigned, Nalia Honeycutt PA Pager: [phone]

## 2017-02-26 ENCOUNTER — Other Ambulatory Visit (HOSPITAL_COMMUNITY): Payer: Self-pay | Admitting: Respiratory Therapy

## 2017-02-26 DIAGNOSIS — R0602 Shortness of breath: Secondary | ICD-10-CM

## 2017-03-19 ENCOUNTER — Ambulatory Visit (HOSPITAL_COMMUNITY)
Admission: RE | Admit: 2017-03-19 | Discharge: 2017-03-19 | Disposition: A | Discharge status: Home / Self Care | Payer: BLUE CROSS/BLUE SHIELD | Source: Ambulatory Visit | Attending: Internal Medicine | Admitting: Internal Medicine

## 2017-03-19 DIAGNOSIS — J449 Chronic obstructive pulmonary disease, unspecified: Secondary | ICD-10-CM | POA: Diagnosis not present

## 2017-03-19 DIAGNOSIS — R0602 Shortness of breath: Secondary | ICD-10-CM | POA: Insufficient documentation

## 2017-03-19 MED ORDER — ALBUTEROL SULFATE (2.5 MG/3ML) 0.083% IN NEBU
2.5000 mg | INHALATION_SOLUTION | Freq: Once | RESPIRATORY_TRACT | Status: AC
  Administered 2017-03-19: 2.5 mg via RESPIRATORY_TRACT

## 2017-03-20 LAB — PULMONARY FUNCTION TEST
DL/VA % pred: 85 %
DL/VA: 4.26 ml/min/mmHg/L
DLCO unc % pred: 80 %
DLCO unc: 21.26 ml/min/mmHg
FEF 25-75 Post: 2.06 L/sec
FEF 25-75 Pre: 1.58 L/sec
FEF2575-%CHANGE-POST: 30 %
FEF2575-%Pred-Post: 81 %
FEF2575-%Pred-Pre: 62 %
FEV1-%Change-Post: 6 %
FEV1-%PRED-PRE: 78 %
FEV1-%Pred-Post: 83 %
FEV1-Post: 2.29 L
FEV1-Pre: 2.15 L
FEV1FVC-%CHANGE-POST: 3 %
FEV1FVC-%PRED-PRE: 94 %
FEV6-%Change-Post: 2 %
FEV6-%PRED-PRE: 84 %
FEV6-%Pred-Post: 86 %
FEV6-Post: 2.97 L
FEV6-Pre: 2.9 L
FEV6FVC-%Pred-Post: 103 %
FEV6FVC-%Pred-Pre: 103 %
FVC-%CHANGE-POST: 2 %
FVC-%PRED-POST: 84 %
FVC-%PRED-PRE: 82 %
FVC-POST: 2.98 L
FVC-PRE: 2.9 L
POST FEV6/FVC RATIO: 100 %
Post FEV1/FVC ratio: 77 %
Pre FEV1/FVC ratio: 74 %
Pre FEV6/FVC Ratio: 100 %
RV % pred: 123 %
RV: 2.5 L
TLC % pred: 101 %
TLC: 5.34 L

## 2017-03-26 ENCOUNTER — Other Ambulatory Visit: Payer: Self-pay | Admitting: Internal Medicine

## 2017-03-26 DIAGNOSIS — M79604 Pain in right leg: Secondary | ICD-10-CM

## 2017-03-26 DIAGNOSIS — M79605 Pain in left leg: Secondary | ICD-10-CM

## 2017-03-27 ENCOUNTER — Other Ambulatory Visit: Payer: BLUE CROSS/BLUE SHIELD

## 2017-04-06 ENCOUNTER — Ambulatory Visit (INDEPENDENT_AMBULATORY_CARE_PROVIDER_SITE_OTHER): Payer: BLUE CROSS/BLUE SHIELD | Admitting: Pulmonary Disease

## 2017-04-06 ENCOUNTER — Encounter: Payer: Self-pay | Admitting: Pulmonary Disease

## 2017-04-06 VITALS — BP 110/90 | HR 83 | Ht 66.0 in | Wt 166.8 lb

## 2017-04-06 DIAGNOSIS — J302 Other seasonal allergic rhinitis: Secondary | ICD-10-CM | POA: Diagnosis not present

## 2017-04-06 DIAGNOSIS — M1990 Unspecified osteoarthritis, unspecified site: Secondary | ICD-10-CM | POA: Diagnosis not present

## 2017-04-06 DIAGNOSIS — R0609 Other forms of dyspnea: Secondary | ICD-10-CM | POA: Diagnosis not present

## 2017-04-06 DIAGNOSIS — M791 Myalgia, unspecified site: Secondary | ICD-10-CM

## 2017-04-06 DIAGNOSIS — R0600 Dyspnea, unspecified: Secondary | ICD-10-CM | POA: Insufficient documentation

## 2017-04-06 NOTE — Patient Instructions (Signed)
   Call or e-mail me if you have any new breathing problems or questions before your next appointment.  Remember to bring your chest x-ray on a disc to your next appointment for my review.

## 2017-04-06 NOTE — Progress Notes (Signed)
Subjective:    Patient ID: [name], female    DOB: 03-Mar-1961, 56 y.o.   MRN: [id number]  HPI Review the patient's records indicates during office visit on 02/22/17 that she was treated with a Depo-Medrol injection and azithromycin with improvement in her cough but persistent shortness of breath on exertion. Patient was given a sample Breo without improvement in dyspnea and possible adverse effects. Review of physical exam indicates no wheezing on auscultation. Patient has a known history of pericardial effusion. She reports prior to her respiratory illness she had no dyspnea. She reports in 2005 she had "bronchial asthma" but no history of recurrent bronchitis. No breathing problems as a child. She does have seasonal allergies with sinus congestion & drainage. She feels like she "breathes heavy" with exertion. She denies any pleurisy currently. She ws noted to have a small pericardial effusion and has follow-up with Dr. Donnie Aho.  She does have chest tightness but no pressure or pain. She denies any reflux, dyspepsia, or morning brash water taste. No fever or sweats. She does have frequent chills. She reports she has been intentionally avoiding inhalant exposures.   Review of Systems No bruising. She has noticed a patchy papular rash that is intermittent. No dysuria or hematuria. She reports diffuse joint pain but no new myalgias or neuropathy. A pertinent 14 point review of systems is negative except as per the history of presenting illness.  Allergies  Allergen Reactions  . Breo Ellipta [Fluticasone Furoate-Vilanterol]   . Ciprofloxacin Hcl   . Codeine   . Nitrofurantoin   . Penicillins   . Pseudoephedrine   . Sulfonamide Derivatives     Current Outpatient Prescriptions on File Prior to Visit  Medication Sig Dispense Refill  . estradiol (VIVELLE-DOT) 0.025 MG/24HR Place 1 patch onto the skin as directed.      Marland Kitchen LORazepam (ATIVAN) 1 MG tablet Take 1 mg by mouth at bedtime as needed.        . Calcium Carbonate (CALTRATE 600 PO) Take 2 tablets by mouth daily.      . Estradiol (VAGIFEM) 10 MCG TABS Place 1 tablet vaginally 2 (two) times a week.    . Linaclotide (LINZESS) 145 MCG CAPS Take 1 capsule by mouth daily. (Patient not taking: Reported on 04/06/2017) 15 capsule 0   No current facility-administered medications on file prior to visit.     Past Medical History:  Diagnosis Date  . Allergic rhinitis   . Anxiety disorder   . Chronic constipation   . Hypoglycemia   . Internal hemorrhoids   . Uterine leiomyoma     Past Surgical History:  Procedure Laterality Date  . ENDOVENOUS ABLATION SAPHENOUS VEIN W/ LASER  2007  . TUBAL LIGATION     bilateral  . VAGINAL HYSTERECTOMY  2003    Family History  Problem Relation Age of Onset  . Stomach cancer      Aunt  . Colitis Sister     crohns  . COPD Father   . Rheum arthritis Maternal Uncle   . Myasthenia gravis Paternal Uncle   . Multiple sclerosis Maternal Grandmother     Social History   Social History  . Marital status: Married    Spouse name: N/A  . Number of children: N/A  . Years of education: N/A   Social History Main Topics  . Smoking status: Passive Smoke Exposure - Never Smoker  . Smokeless tobacco: Never Used     Comment: as a child.  Marland Kitchen  Alcohol use No  . Drug use: No  . Sexual activity: Not Asked   Other Topics Concern  . None   Social History Narrative   [facility] (04/06/17):   Originally from [place] 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 No pets currently. Remote bird exposure. No mold or hot tub exposure.       Objective:   Physical Exam BP 110/90 (BP Location: Right Arm, Patient Position: Sitting, Cuff Size: Normal)   Pulse 83   Ht  (1.676 m)   Wt 166 lb 12.8 oz (75.7 kg)   SpO2 100%   BMI 26.92 kg/m  General:  Awake. Alert. No  acute distress.  Integument:  Warm & dry. No rash on exposed skin. No bruising on exposed skin. Extremities:  No cyanosis or clubbing.  Lymphatics:  No appreciated cervical or supraclavicular lymphadenoapthy. HEENT:  Moist mucus membranes. No oral ulcers. No scleral injection or icterus. Mild bilateral nasal turbinate swelling. Cardiovascular:  Regular rate and rhythm. No edema. No appreciable JVD.  Pulmonary:  Good aeration & clear to auscultation bilaterally. Symmetric chest wall expansion. No accessory muscle use on room air. Abdomen: Soft. Normal bowel sounds. Nondistended. Grossly nontender. Musculoskeletal:  Normal bulk and tone. Hand grip strength 5/5 bilaterally. No joint deformity or effusion appreciated. Mild synovial thickening of bilateral PIP & DIP joints. Neurological:  CN 2-12 grossly in tact. No meningismus. Moving all 4 extremities equally. Symmetric brachioradialis deep tendon reflexes. Psychiatric:  Mood and affect congruent. Speech normal rhythm, rate & tone.   PFT 03/19/17: FVC 2.90 L (82%) FEV1 2.15 L (78%) FEV1/FVC 0.74 FEF 25-75 1.58 L (62%) negative bronchodilator response TLC 5.34 L (101%) RV 123% ERV 66% DLCO uncorrected 80%    Assessment & Plan:  56 y.o. female with ongoing myalgias and arthralgias. Her dyspnea on exertion is of unclear significance and I suspect more related to some element of deconditioning and recovery from her respiratory illness. Reviewing her pulmonary function testing shows normal spirometry, lung volumes, and carbon dioxide diffusion capacity. She has no significant bronchodilator response to suggest possible underlying asthma either. Patient reports she has had previous x-ray imaging which is not available for my review but reportedly was normal. As for cough and other symptoms have improved my hope is that her dyspnea & discomfort with exertion will improve as well. I instructed the patient contact me if she had any new breathing problems or  questions before her next appointment.  1. Dyspnea on exertion: Holding off on further testing. Patient to bring her chest x-ray imaging for my review at next appointment. May require methacholine challenge testing and or CT imaging of the chest if symptoms persist. 2. Arthritis/myalgia: Has upcoming appointment with neurology for evaluation. Holding off on serum testing. 3. Chronic seasonal allergic rhinitis: Recommended using intranasal steroid as needed. Recommended avoiding antihistamine medications which could increase fatigue. 4. Follow-up: Patient to return to clinic in 6 weeks or sooner if needed.  Donna Christen Jamison Neighbor, M.D. [facility] Pager:  [phone] After 3pm or if no response, call [phone] 12:00 PM 04/06/17

## 2017-04-10 ENCOUNTER — Ambulatory Visit: Payer: BLUE CROSS/BLUE SHIELD | Admitting: Diagnostic Neuroimaging

## 2017-04-11 ENCOUNTER — Telehealth: Payer: Self-pay | Admitting: *Deleted

## 2017-04-11 NOTE — Telephone Encounter (Signed)
LMVM for pt to return call to reschedule  NP appt from 04-10-17 power outage.

## 2017-04-16 NOTE — Telephone Encounter (Signed)
Appt rescheduled at 04-30-17 at 1030.

## 2017-04-30 ENCOUNTER — Ambulatory Visit (INDEPENDENT_AMBULATORY_CARE_PROVIDER_SITE_OTHER): Payer: BLUE CROSS/BLUE SHIELD | Admitting: Diagnostic Neuroimaging

## 2017-04-30 ENCOUNTER — Encounter: Payer: Self-pay | Admitting: Diagnostic Neuroimaging

## 2017-04-30 VITALS — BP 109/75 | HR 78 | Ht 66.0 in | Wt 169.8 lb

## 2017-04-30 DIAGNOSIS — M79605 Pain in left leg: Secondary | ICD-10-CM | POA: Diagnosis not present

## 2017-04-30 DIAGNOSIS — M791 Myalgia, unspecified site: Secondary | ICD-10-CM

## 2017-04-30 DIAGNOSIS — M79604 Pain in right leg: Secondary | ICD-10-CM

## 2017-04-30 DIAGNOSIS — R292 Abnormal reflex: Secondary | ICD-10-CM

## 2017-04-30 DIAGNOSIS — R5383 Other fatigue: Secondary | ICD-10-CM

## 2017-04-30 DIAGNOSIS — M542 Cervicalgia: Secondary | ICD-10-CM

## 2017-04-30 NOTE — Patient Instructions (Signed)
Thank you for coming to see [place] at [facility]. I hope we have been able to provide you high quality care today.  You may receive a patient satisfaction survey over the next few weeks. We would appreciate your feedback and comments so that we may continue to improve ourselves and the health of our patients.  - check lab testing  - check EMG/NCS (electrical nerve test)  - check MRI brain / cervical  - alternate ibuprofen and tylenol for pain; may consider gabapentin in future for nerve pain  - agree with sertraline for anxiety / stress symptoms   ~~~~~~~~~~~~~~~~~~~~~~~~~~~~~~~~~~~~~~~~~~~~~~~~~~~~~~~~~~~~~~~~~  DR. PENUMALLI'S GUIDE TO HAPPY AND HEALTHY LIVING These are some of my general health and wellness recommendations. Some of them may apply to you better than others. Please use common sense as you try these suggestions and feel free to ask me any questions.   ACTIVITY/FITNESS Mental, social, emotional and physical stimulation are very important for brain and body health. Try learning a new activity (arts, music, language, sports, games).  Keep moving your body to the best of your abilities. You can do this at home, inside or outside, the park, community center, gym or anywhere you like. Consider a physical therapist or personal trainer to get started. Consider the app Sworkit. Fitness trackers such as smart-watches, smart-phones or Fitbits can help as well.   NUTRITION Eat more plants: colorful vegetables, nuts, seeds and berries.  Eat less sugar, salt, preservatives and processed foods.  Avoid toxins such as cigarettes and alcohol.  Drink water when you are thirsty. Warm water with a slice of lemon is an excellent morning drink to start the day.  Consider these websites for more information The Nutrition Source (https://www.henry-hernandez.biz/) Precision Nutrition (WindowBlog.ch)   RELAXATION Consider  practicing mindfulness meditation or other relaxation techniques such as deep breathing, prayer, yoga, tai chi, massage. See website mindful.org or the apps Headspace or Calm to help get started.   SLEEP Try to get at least 7-8+ hours sleep per day. Regular exercise and reduced caffeine will help you sleep better. Practice good sleep hygeine techniques. See website sleep.org for more information.   PLANNING Prepare estate planning, living will, healthcare POA documents. Sometimes this is best planned with the help of an attorney. Theconversationproject.org and agingwithdignity.org are excellent resources.

## 2017-04-30 NOTE — Progress Notes (Signed)
[facility]  PATIENT: [name] DOB: January 10, 1961  REFERRING CLINICIAN:  A Ramachandran HISTORY FROM: patient  REASON FOR VISIT: new consult    HISTORICAL  CHIEF COMPLAINT:  Chief Complaint  Patient presents with  . Burning, painful legs    rm 7, New Pt, "sharp pains in legs, sometimes on other areas of body, extreme fatigue; numbness in mouth  -Feb 2018"    HISTORY OF PRESENT ILLNESS:     56 year old right-handed female here for evaluation of constellation of symptoms including sharp shooting pains in legs, fatigue, numbness in mouth. In February 2018 patient was having increasing shortness of breath. She was diagnosed with pneumonia, pleurisy, pericardial effusion. This was treated conservatively. Patient was tried on several inhalers and had some side effects which increased her symptoms. She started to have increasing sharp shooting pains in her legs, thighs, hips, left arm and hand. In spite of changing her medication symptoms continued. Now patient having significant fatigue, anxiety, nervousness.  Patient has also noticed numbness and tingling in her mouth and tongue.  No specific triggering or alleviating factors. Patient having increasing weight gain. Constipation ringing in ears shortness of breath feeling cold increased thirst allergies.    REVIEW OF SYSTEMS: Full 14 system review of systems performed and negative with exception of: chills confusion numbness weakness anxiety   ALLERGIES: Allergies  Allergen Reactions  . Breo Ellipta [Fluticasone Furoate-Vilanterol]   . Ciprofloxacin Hcl   . Codeine   . Nitrofurantoin   . Penicillins   . Pseudoephedrine     04/30/17 can take OTC brand  . Sulfonamide Derivatives     HOME MEDICATIONS: Outpatient Medications Prior to Visit  Medication Sig Dispense Refill  . calcium-vitamin D (OSCAL WITH D) 250-125 MG-UNIT tablet Take 2 tablets by mouth daily.    Marland Kitchen estradiol (VIVELLE-DOT) 0.025 MG/24HR Place  1 patch onto the skin as directed.      Marland Kitchen LORazepam (ATIVAN) 1 MG tablet Take 1 mg by mouth at bedtime as needed.      . Multiple Vitamins-Minerals (MULTIVITAMIN WITH MINERALS) tablet Take 1 tablet by mouth daily.    Marland Kitchen triamcinolone (NASACORT ALLERGY 24HR) 55 MCG/ACT AERO nasal inhaler Place 2 sprays into the nose as needed.    Marland Kitchen albuterol (PROVENTIL HFA;VENTOLIN HFA) 108 (90 Base) MCG/ACT inhaler Inhale into the lungs every 6 (six) hours as needed for wheezing or shortness of breath.    . Calcium Carbonate (CALTRATE 600 PO) Take 2 tablets by mouth daily.      . Estradiol (VAGIFEM) 10 MCG TABS Place 1 tablet vaginally 2 (two) times a week.    Marland Kitchen ipratropium (ATROVENT) 0.06 % nasal spray Place 2 sprays into both nostrils as needed for rhinitis.    . Linaclotide (LINZESS) 145 MCG CAPS Take 1 capsule by mouth daily. (Patient not taking: Reported on 04/06/2017) 15 capsule 0  . Saccharomyces boulardii (FLORASTOR PO) Take 2 tablets by mouth daily.     No facility-administered medications prior to visit.     PAST MEDICAL HISTORY: Past Medical History:  Diagnosis Date  . Allergic rhinitis   . Anxiety disorder   . Chronic constipation   . Hypoglycemia   . Internal hemorrhoids   . Pneumonia    pleural effusion  . Uterine leiomyoma     PAST SURGICAL HISTORY: Past Surgical History:  Procedure Laterality Date  . ENDOVENOUS ABLATION SAPHENOUS VEIN W/ LASER  2007  . KNEE CARTILAGE SURGERY Left   . TUBAL LIGATION  1988   bilateral  . VAGINAL HYSTERECTOMY  2003    FAMILY HISTORY: Family History  Problem Relation Age of Onset  . Stomach cancer      Aunt  . Colitis Sister     ulcerative colitis  . COPD Father   . Rheum arthritis Maternal Uncle   . Myasthenia gravis Paternal Uncle   . Multiple sclerosis Maternal Grandmother     SOCIAL HISTORY:  Social History   Social History  . Marital status: Married    Spouse name: Adela Lank  . Number of children: 3  . Years of education: 12    Occupational History  .      home maker   Social History Main Topics  . Smoking status: Passive Smoke Exposure - Never Smoker  . Smokeless tobacco: Never Used     Comment: as a child.  . Alcohol use No  . Drug use: No  . Sexual activity: Not on file   Other Topics Concern  . Not on file   Social History Narrative   [facility] (04/06/17):   Originally from [facility]. 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 No pets currently. Remote bird exposure. No mold or hot tub exposure.      PHYSICAL EXAM  GENERAL EXAM/CONSTITUTIONAL: Vitals:  Vitals:   04/30/17 1020  BP: 109/75  Pulse: 78  Weight: 169 lb 12.8 oz (77 kg)  Height: 5\' 6"  (1.676 m)     Body mass index is 27.41 kg/m.  Visual Acuity Screening   Right eye Left eye Both eyes  Without correction: 20/30 20/40   With correction:        Patient is in no distress; well developed, nourished and groomed; neck is supple  CARDIOVASCULAR:  Examination of carotid arteries is normal; no carotid bruits  Regular rate and rhythm, no murmurs  Examination of peripheral vascular system by observation and palpation is normal  EYES:  Ophthalmoscopic exam of optic discs and posterior segments is normal; no papilledema or hemorrhages  MUSCULOSKELETAL:  Gait, strength, tone, movements noted in Neurologic exam below  NEUROLOGIC: MENTAL STATUS:  No flowsheet data found.  awake, alert, oriented to person, place and time  recent and remote memory intact  normal attention and concentration  language fluent, comprehension intact, naming intact,   fund of knowledge appropriate  CRANIAL NERVE:   2nd - no papilledema on fundoscopic exam  2nd, 3rd, 4th, 6th - pupils equal and reactive to light, visual fields full to confrontation, extraocular muscles intact, no nystagmus  5th -  facial sensation symmetric  7th - facial strength symmetric  8th - hearing intact  9th - palate elevates symmetrically, uvula midline  11th - shoulder shrug symmetric  12th - tongue protrusion midline  MOTOR:   normal bulk and tone, full strength in the BUE, BLE  SENSORY:   normal and symmetric to light touch, temperature, vibration  DECR PP IN BOTTOM OF FEET  COORDINATION:   finger-nose-finger, fine finger movements normal  REFLEXES:   deep tendon reflexes present and symmetric  BRISK IN KNEES AND ANKLES  GAIT/STATION:   narrow based gait; able to walk on toes, heels and tandem; romberg is negative    DIAGNOSTIC DATA (LABS, IMAGING, TESTING) - I reviewed patient records, labs, notes, testing and imaging myself where available.  No results found for: WBC,  HGB, HCT, MCV, PLT    Component Value Date/Time   NA 141 06/14/2012 1514   K 5.3 (H) 06/14/2012 1514   CL 105 06/14/2012 1514   CO2 27 06/14/2012 1514   GLUCOSE 82 06/14/2012 1514   BUN 21 06/14/2012 1514   CREATININE 0.8 06/14/2012 1514   CALCIUM 10.1 06/14/2012 1514   PROT 7.5 06/14/2012 1514   ALBUMIN 4.4 06/14/2012 1514   AST 24 06/14/2012 1514   ALT 16 06/14/2012 1514   ALKPHOS 56 06/14/2012 1514   BILITOT 0.3 06/14/2012 1514   No results found for: CHOL, HDL, LDLCALC, LDLDIRECT, TRIG, CHOLHDL No results found for: ZOXW9UHGBA1C No results found for: VITAMINB12 Lab Results  Component Value Date   TSH 0.930 06/14/2012        ASSESSMENT AND PLAN  56 y.o. year old female here with combination of symptoms since Feb 2018 including diffuse myalgias, pain, numbness, tingling, fatigue.  1. Facial / mouth numbness 2. Leg aches / pains 3. Diffuse fatigue   Ddx: metabolic, post-infx, CNS autoimmune/inflamm, cervical myelopathy, lumbar radiculopathy, peripheral neuropathy  1. Pain in both lower extremities   2. Hyperreflexia   3. Neck pain   4. Other fatigue   5. Myalgia      PLAN: - lab  testing - EMG/NCS - MRI brain / cervical - alternate ibuprofen and tylenol for pain; may consider gabapentin in future for nerve pain - agree with sertraline for anxiety / stress symptoms  Orders Placed This Encounter  Procedures  . MR BRAIN W WO CONTRAST  . MR CERVICAL SPINE W WO CONTRAST  . Hemoglobin A1c  . TSH  . T4, Free  . CK  . Aldolase  . Acetylcholine Receptor, Binding  . NCV with EMG(electromyography)   Return for for NCV/EMG.  I reviewed labs, notes, records myself. I summarized findings and reviewed with patient, for this high risk condition (facial numbness, leg pain, hyperreflexia) requiring high complexity decision making.     Suanne MarkerVIKRAM R. Arneisha Kincannon, MD 04/30/2017, 10:50 AM Certified in Neurology, Neurophysiology and Neuroimaging  [facility] [address], [address] [place], [place] [address] [phone]

## 2017-05-01 ENCOUNTER — Telehealth: Payer: Self-pay | Admitting: Diagnostic Neuroimaging

## 2017-05-01 ENCOUNTER — Encounter: Payer: Self-pay | Admitting: Diagnostic Neuroimaging

## 2017-05-01 NOTE — Telephone Encounter (Signed)
Spoke with patient and updated her history.

## 2017-05-01 NOTE — Telephone Encounter (Signed)
I spoke with the patient regarding her MRI. She requested to speak with the nurse regarding some information she forgot to give to Dr. Leta Baptist when they met. She said that she forgot to tell him some family history and some treatments she is receiving and would like to have it documented please call and advise.

## 2017-05-02 ENCOUNTER — Ambulatory Visit (INDEPENDENT_AMBULATORY_CARE_PROVIDER_SITE_OTHER): Payer: BLUE CROSS/BLUE SHIELD

## 2017-05-02 DIAGNOSIS — R5383 Other fatigue: Secondary | ICD-10-CM

## 2017-05-02 DIAGNOSIS — M542 Cervicalgia: Secondary | ICD-10-CM

## 2017-05-02 DIAGNOSIS — M79605 Pain in left leg: Secondary | ICD-10-CM | POA: Diagnosis not present

## 2017-05-02 DIAGNOSIS — R292 Abnormal reflex: Secondary | ICD-10-CM | POA: Diagnosis not present

## 2017-05-02 DIAGNOSIS — M79604 Pain in right leg: Secondary | ICD-10-CM

## 2017-05-02 MED ORDER — GADOPENTETATE DIMEGLUMINE 469.01 MG/ML IV SOLN: 18.0000 mL | Freq: Once | INTRAVENOUS | Status: AC | PRN

## 2017-05-03 ENCOUNTER — Encounter (INDEPENDENT_AMBULATORY_CARE_PROVIDER_SITE_OTHER): Payer: Self-pay

## 2017-05-03 ENCOUNTER — Ambulatory Visit (INDEPENDENT_AMBULATORY_CARE_PROVIDER_SITE_OTHER): Payer: BLUE CROSS/BLUE SHIELD | Admitting: Diagnostic Neuroimaging

## 2017-05-03 DIAGNOSIS — M79605 Pain in left leg: Secondary | ICD-10-CM

## 2017-05-03 DIAGNOSIS — R292 Abnormal reflex: Secondary | ICD-10-CM

## 2017-05-03 DIAGNOSIS — R5383 Other fatigue: Secondary | ICD-10-CM

## 2017-05-03 DIAGNOSIS — M79604 Pain in right leg: Secondary | ICD-10-CM

## 2017-05-03 DIAGNOSIS — Z0289 Encounter for other administrative examinations: Secondary | ICD-10-CM

## 2017-05-03 DIAGNOSIS — M542 Cervicalgia: Secondary | ICD-10-CM

## 2017-05-04 NOTE — Procedures (Signed)
[facility]  NCS (NERVE CONDUCTION STUDY) WITH EMG (ELECTROMYOGRAPHY) REPORT   STUDY DATE: 05/03/17 PATIENT NAME: [name] DOB: 1961/05/14 MRN: [id number]  ORDERING CLINICIAN: Joycelyn Schmid, MD   TECHNOLOGIST: Charlesetta Ivory ELECTROMYOGRAPHER: Glenford Bayley. Ronalda Walpole, MD  CLINICAL INFORMATION: 56 year old female with upper and lower extremity numbness and pain.   FINDINGS: NERVE CONDUCTION STUDY:  Left median, left ulnar, bilateral peroneal and bilateral tibial motor responses are normal. Bilateral tibial and left ulnar F wave latencies are normal.  Bilateral sural, bilateral superficial peroneal, left median and left ulnar sensory responses are normal.   NEEDLE ELECTROMYOGRAPHY: Needle examination of left vastus medialis, left tibialis anterior, left gastrocnemius muscles is normal.   IMPRESSION:  This is a normal study. No electrodiagnostic evidence of large fiber neuropathy or myopathy at this time.     INTERPRETING PHYSICIAN:  Suanne Marker, MD Certified in Neurology, Neurophysiology and Neuroimaging  [facility] [address] [place], [place] [address] [phone]  [facility]    Nerve / Sites Muscle Latency Ref. Amplitude Ref. Rel Amp Segments Distance Velocity Ref. Area    ms ms mV mV %  cm m/s m/s mVms  L Median - APB     Wrist APB 3.0 ?4.4 5.9 ?4.0 100 Wrist - APB 7   20.5     Upper arm APB 6.5  5.2  88.6 Upper arm - Wrist 20 57 ?49 17.3  L Ulnar - ADM     Wrist ADM 2.2 ?3.3 9.4 ?6.0 100 Wrist - ADM 7   29.3     B.Elbow ADM 5.1  8.8  94.1 B.Elbow - Wrist 18 63 ?49 28.6     A.Elbow ADM 6.6  8.2  92.2 A.Elbow - B.Elbow 10 66 ?49 28.5         A.Elbow - Wrist      R Peroneal - EDB     Ankle EDB 5.3 ?6.5 3.2 ?2.0 100 Ankle - EDB 9   10.9     Fib head EDB 11.1  3.1  95.8 Fib head - Ankle 28 48 ?44 10.7     Pop fossa EDB 12.7  2.9  93.6 Pop fossa - Fib head 9 56 ?44 10.1         Pop fossa - Ankle      L  Peroneal - EDB     Ankle EDB 5.0 ?6.5 3.6 ?2.0 100 Ankle - EDB 9   15.0     Fib head EDB 10.8  3.0  83.6 Fib head - Ankle 28 48 ?44 14.9     Pop fossa EDB 12.8  2.8  93.8 Pop fossa - Fib head 10 51 ?44 14.7         Pop fossa - Ankle      R Tibial - AH     Ankle AH 3.5 ?5.8 14.9 ?4.0 100 Ankle - AH 9   44.3     Pop fossa AH 11.8  12.2  82.3 Pop fossa - Ankle 36 44 ?41 41.0  L Tibial - AH     Ankle AH 4.2 ?5.8 16.7 ?4.0 100 Ankle - AH 9   43.9     Pop fossa AH 12.6  13.7  82.1 Pop fossa - Ankle 36 43 ?41 39.4                 SNC    Nerve / Sites Rec. Site Peak Lat Ref.  Amp Ref. Segments  Distance    ms ms V V  cm  L Sural - Ankle (Calf)     Calf Ankle 3.2 ?4.4 9 ?6 Calf - Ankle 14  R Sural - Ankle (Calf)     Calf Ankle 3.0 ?4.4 9 ?6 Calf - Ankle 14  R Superficial peroneal - Ankle     Lat leg Ankle 4.0 ?4.4 5 ?6 Lat leg - Ankle 14  L Superficial peroneal - Ankle     Lat leg Ankle 3.9 ?4.4 6 ?6 Lat leg - Ankle 14  L Median - Orthodromic (Dig II, Mid palm)     Dig II Wrist 2.6 ?3.4 41 ?10 Dig II - Wrist 13  L Ulnar - Orthodromic, (Dig V, Mid palm)     Dig V Wrist 2.5 ?3.1 17 ?5 Dig V - Wrist 2411                 F  Wave    Nerve F Lat Ref.   ms ms  R Tibial - AH 48.1 ?56.0  L Tibial - AH 49.0 ?56.0  L Ulnar - ADM 25.1 ?32.0            EMG full     EMG Summary Table    Spontaneous MUAP Recruitment  Muscle IA Fib PSW Fasc Other Amp Dur. Poly Pattern  L. Vastus medialis Normal None None None _______ Normal Normal Normal Normal  L. Tibialis anterior Normal None None None _______ Normal Normal Normal Normal  L. Gastrocnemius (Medial head) Normal None None None _______ Normal Normal Normal Normal

## 2017-05-07 LAB — ALDOLASE: ALDOLASE: 4.4 U/L (ref 3.3–10.3)

## 2017-05-07 LAB — HEMOGLOBIN A1C
Est. average glucose Bld gHb Est-mCnc: 100 mg/dL
HEMOGLOBIN A1C: 5.1 % (ref 4.8–5.6)

## 2017-05-07 LAB — T4, FREE: FREE T4: 1.2 ng/dL (ref 0.82–1.77)

## 2017-05-07 LAB — CK: CK TOTAL: 104 U/L (ref 24–173)

## 2017-05-07 LAB — TSH: TSH: 2.8 u[IU]/mL (ref 0.450–4.500)

## 2017-05-07 LAB — ACETYLCHOLINE RECEPTOR, BINDING: AChR Binding Ab, Serum: <0.03 nmol/L (ref 0.00–0.24)

## 2017-05-08 ENCOUNTER — Telehealth: Payer: Self-pay | Admitting: Diagnostic Neuroimaging

## 2017-05-08 NOTE — Telephone Encounter (Signed)
I called patient with MRI results.   MRI of the brain shows nonspecific white matter changes. Also shows incidental left frontal meningioma, 12 mm. I think these are incidental findings.  MRI cervical spine shows mild spinal stenosis at C3-4 and C5-6. This is potentially symptomatic as patient has hyperreflexia and diffuse upper and lower extremity symptoms. Offered conservative management and monitoring of symptoms versus neurosurgery consult. Patient will monitor symptoms over the next few weeks and let me know.  Also could consider pain management consult and MRI lumbar spine if symptoms significant worsened in the lower extremities.   Suanne MarkerVIKRAM R. Taevin Mcferran, MD 05/08/2017, 7:20 PM Certified in Neurology, Neurophysiology and Neuroimaging  [facility] [address], [address] [place], [place] [address] [phone]

## 2017-05-08 NOTE — Telephone Encounter (Signed)
Pt request lab results and MRI. Please call

## 2017-05-09 ENCOUNTER — Telehealth: Payer: Self-pay | Admitting: *Deleted

## 2017-05-09 NOTE — Telephone Encounter (Signed)
See other phone note; pt notified.

## 2017-05-09 NOTE — Telephone Encounter (Signed)
LVM informing patient her lab results are unremarkable. Left number for any questions. 

## 2017-05-15 ENCOUNTER — Telehealth: Payer: Self-pay | Admitting: Diagnostic Neuroimaging

## 2017-05-15 DIAGNOSIS — G8929 Other chronic pain: Secondary | ICD-10-CM

## 2017-05-15 DIAGNOSIS — M5441 Lumbago with sciatica, right side: Principal | ICD-10-CM

## 2017-05-15 MED ORDER — GABAPENTIN 300 MG PO CAPS: 300.0000 mg | ORAL_CAPSULE | Freq: Every day | ORAL | 3 refills | Status: DC

## 2017-05-15 NOTE — Telephone Encounter (Signed)
Patient called office in reference to how to handle her cervical stenosis.  Patient states symptoms have gotten worse over the last 2 weeks having pins/needle feeling all over her body along with increased fatigue and a hard time sleeping.  She would like to know if she is to come in to discuss with Dr. Marjory LiesPenumalli this week.  Please call if patient does not answer please leave a message.

## 2017-05-15 NOTE — Telephone Encounter (Signed)
Spoke with patient and informed her Dr Marjory LiesPenumalli sent in Rx for Gabapentin to take at bedtime. Advised her he ordered MRI of lumbar spine. Advised that after receiving those results he will consider referring her to a neurosurgeon. Advised it may take several days for insurance authorization on MRI. She will then receive a call to schedule it.  Advised she call for any other concerns. Patient verbalized understanding, appreciation.

## 2017-05-15 NOTE — Telephone Encounter (Signed)
1. I will check MRI lumbar spine (low back pain --> right leg; eval for radiculopathy) 2. Consider gabapentin 300mg  at bedtime. 3. Then may consider neurosurgery consult.    Suanne MarkerVIKRAM R. Erie Sica, MD 05/15/2017, 3:55 PM Certified in Neurology, Neurophysiology and Neuroimaging  [facility] [address], [address] [place], [place] [address] [phone]

## 2017-05-15 NOTE — Telephone Encounter (Addendum)
Spoke with patient who stated her right knee has gone "weak" on her three times recently. She has not fallen. She stated she is waking up with her mouth numb. She is experiencing pins and needles sensations especially in her left elbow and legs, but she stated it varies in location. She is applying heat, pain patches and alternating ibuprofen with Tylenol for fair relief. She stated some days she is fine, others she is not. Patient is asking what is next, MRI of lower back, physical therapy, waiting to see how she does? She stated her lower back does not hurt however. This RN reviewed Dr Richrd HumblesPenumalli's plan and his phone note to her re: MRI results. Advised her this RN will discuss with Dr Marjory LiesPenumalli and call her back late this afternoon. Patient verbalized understanding, appreciation.

## 2017-05-15 NOTE — Addendum Note (Signed)
Addended byJoycelyn Schmid: Icess Bertoni on: 05/15/2017 03:57 PM   Modules accepted: Orders

## 2017-05-16 ENCOUNTER — Telehealth: Payer: Self-pay | Admitting: Diagnostic Neuroimaging

## 2017-05-16 NOTE — Telephone Encounter (Signed)
[employer] did not approve the MR lumbar.. Needing additional information. The phone number for the peer to peer is [phone] and the member ID is [id number] & DOB is August 21, 1961. The case close's on Friday 05/18/17.

## 2017-05-17 ENCOUNTER — Telehealth: Payer: Self-pay | Admitting: Diagnostic Neuroimaging

## 2017-05-17 NOTE — Telephone Encounter (Signed)
Called patient and advised her to give Gabapentin more time; she has only been on med since 05/15/17. Inquired what time of night she is taking it. She stated at 9 pm, so this RN advised she take it at 8 pm. She stated that it is helping with her pain, and she also stated she has had more energy. She verbalized understanding, appreciation of call.

## 2017-05-17 NOTE — Telephone Encounter (Signed)
Noted, thank you

## 2017-05-17 NOTE — Telephone Encounter (Signed)
I called for approval. The agent told me that scan was already approved and posted on the website.  Auth #: [id number]  -VRP

## 2017-05-17 NOTE — Telephone Encounter (Signed)
I spoke with the patient to schedule her MRI.[name]. She is scheduled for 05/23/17 at our [facility] mobile unit.. She informed me that the gabapentin is working well with her pain.. She feels like it is kind of strong because she states she cant really drive until about lunch time.. She doesn't know if she can get a smaller dose or if that is something that she will get use too.. She states she doesn't want to change it too much because it is helping with her pain.

## 2017-05-23 ENCOUNTER — Other Ambulatory Visit: Payer: BLUE CROSS/BLUE SHIELD

## 2017-05-23 ENCOUNTER — Telehealth: Payer: Self-pay | Admitting: Diagnostic Neuroimaging

## 2017-05-23 NOTE — Telephone Encounter (Signed)
Spoke with and advised her per Dr Richrd HumblesPenumalli's verbal to this RN, she may take her Lorazepam 1/2 tablet 45 min prior to MRI. Advised her that if she feels she needs it, she may take other half just prior to MRI. Advised she must have a driver. Patient then stated she has had some tightening of her lower legs, and jerking motions. She stated she felt she "may have overdone it that day". She verbalized understanding of call.

## 2017-05-23 NOTE — Telephone Encounter (Signed)
Ok to take her own xanax. -VRP

## 2017-05-23 NOTE — Telephone Encounter (Signed)
I had to reschedule the patients MRI with us due to our machine being down.. But she also informed me that she may need something to calm her nerves.. She is rescheduled for 05/30/17 for our [facility] mobile unit.

## 2017-05-30 ENCOUNTER — Telehealth: Payer: Self-pay | Admitting: Diagnostic Neuroimaging

## 2017-05-30 ENCOUNTER — Ambulatory Visit (INDEPENDENT_AMBULATORY_CARE_PROVIDER_SITE_OTHER): Payer: BLUE CROSS/BLUE SHIELD

## 2017-05-30 ENCOUNTER — Encounter (INDEPENDENT_AMBULATORY_CARE_PROVIDER_SITE_OTHER): Payer: Self-pay

## 2017-05-30 DIAGNOSIS — M5441 Lumbago with sciatica, right side: Secondary | ICD-10-CM

## 2017-05-30 DIAGNOSIS — G8929 Other chronic pain: Secondary | ICD-10-CM

## 2017-05-30 NOTE — Telephone Encounter (Signed)
Returned pt's call. Asked that she call back to discuss specific side effects that she may be experiencing due gabapentin.

## 2017-05-30 NOTE — Telephone Encounter (Signed)
Pt is concerned about the side effects from the gabapentin, wants to know if she should continue taking it.

## 2017-05-31 ENCOUNTER — Encounter: Payer: Self-pay | Admitting: Pulmonary Disease

## 2017-05-31 ENCOUNTER — Ambulatory Visit (INDEPENDENT_AMBULATORY_CARE_PROVIDER_SITE_OTHER): Payer: BLUE CROSS/BLUE SHIELD | Admitting: Pulmonary Disease

## 2017-05-31 VITALS — BP 98/72 | HR 85 | Ht 66.0 in | Wt 167.8 lb

## 2017-05-31 DIAGNOSIS — R0609 Other forms of dyspnea: Secondary | ICD-10-CM | POA: Diagnosis not present

## 2017-05-31 DIAGNOSIS — R0600 Dyspnea, unspecified: Secondary | ICD-10-CM

## 2017-05-31 DIAGNOSIS — J302 Other seasonal allergic rhinitis: Secondary | ICD-10-CM | POA: Diagnosis not present

## 2017-05-31 DIAGNOSIS — R499 Unspecified voice and resonance disorder: Secondary | ICD-10-CM

## 2017-05-31 NOTE — Patient Instructions (Signed)
   Remember all of the dietary and lifestyle changes we talked about today to minimize any reflux/indigestion.  Try taking Pepcid/Famotidine 20mg  or 40mg  at night before bed for a couple of weeks to see if you voice changes improve. Call me if they do not.  Let me know if you have any new breathing problems before your next appointment or questions.

## 2017-05-31 NOTE — Progress Notes (Signed)
Subjective:    Patient ID: [name], female    DOB: 1961-05-08, 56 y.o.   MRN: [id number]  C.C.:  Follow-up for Dyspnea on Exertion, Arthralgias/Myalgias, & Chronic Seasonal Allergic Rhinitis.  HPI Dyspnea on exertion: Suspect multifactorial in etiology. Held off on further workup pending my review the patient's x-ray and her evaluation by neurology after last appointment. She reports her dyspnea seems to be improving. She hasn't needed her albuterol inhaler in some time. No coughing or wheezing.   Arthralgias/myalgias: Referred to neurology previously. She was diagnosed with cervical stenosis.   Chronic seasonal allergic rhinitis: Recommended intranasal corticosteroid use for symptomatic relief at last appointment. She denies any sinus congestion or pressure. She is hasn't required Nasacort in a quile.   Review of Systems No chest pain or tightness. No fever or chills. No abdominal pain or nausea. She does report a hoarse voice but denies any morning brash water taste. She has attributed these voice changes to the Gabapentin she was started.   Allergies  Allergen Reactions  . Breo Ellipta [Fluticasone Furoate-Vilanterol]   . Ciprofloxacin Hcl   . Codeine   . Nitrofurantoin   . Penicillins   . Pseudoephedrine     04/30/17 can take OTC brand  . Sulfonamide Derivatives     Current Outpatient Prescriptions on File Prior to Visit  Medication Sig Dispense Refill  . albuterol (PROVENTIL HFA;VENTOLIN HFA) 108 (90 Base) MCG/ACT inhaler Inhale into the lungs every 6 (six) hours as needed for wheezing or shortness of breath.    . calcium-vitamin D (OSCAL WITH D) 250-125 MG-UNIT tablet Take 2 tablets by mouth daily.    Marland Kitchen estradiol (VIVELLE-DOT) 0.025 MG/24HR Place 1 patch onto the skin as directed.      . gabapentin (NEURONTIN) 300 MG capsule Take 1 capsule (300 mg total) by mouth at bedtime. 30 capsule 3  . LORazepam (ATIVAN) 1 MG tablet Take 1 mg by mouth at bedtime as needed.      .  Multiple Vitamins-Minerals (MULTIVITAMIN WITH MINERALS) tablet Take 1 tablet by mouth daily.    . sertraline (ZOLOFT) 50 MG tablet Take 50 mg by mouth daily. 04/30/17 has not started  0  . triamcinolone (NASACORT ALLERGY 24HR) 55 MCG/ACT AERO nasal inhaler Place 2 sprays into the nose as needed.     Current Facility-Administered Medications on File Prior to Visit  Medication Dose Route Frequency Provider Last Rate Last Dose  . gadopentetate dimeglumine (MAGNEVIST) injection 18 mL  18 mL Intravenous Once PRN Penumalli, Glenford Bayley, MD        Past Medical History:  Diagnosis Date  . Allergic rhinitis   . Anxiety disorder   . Chronic constipation   . Hypoglycemia   . Internal hemorrhoids   . Pneumonia    pleural effusion  . S/P sclerotherapy of varicose veins   . Uterine leiomyoma     Past Surgical History:  Procedure Laterality Date  . ENDOVENOUS ABLATION SAPHENOUS VEIN W/ LASER  2007  . KNEE CARTILAGE SURGERY Left   . TUBAL LIGATION  1988   bilateral  . VAGINAL HYSTERECTOMY  2003    Family History  Problem Relation Age of Onset  . Stomach cancer Unknown        Aunt  . Colitis Sister        ulcerative colitis  . COPD Father   . Rheum arthritis Maternal Uncle   . Myasthenia gravis Paternal Uncle   . Multiple sclerosis Maternal Grandmother   .  Thyroid disease Mother   . Stroke Paternal Grandmother        deceased age 86, poss aneurysum  . Aneurysm Son        poss due to injury    Social History   Social History  . Marital status: Married    Spouse name: Adela Lank  . Number of children: 3  . Years of education: 12   Occupational History  .      home maker   Social History Main Topics  . Smoking status: Passive Smoke Exposure - Never Smoker  . Smokeless tobacco: Never Used     Comment: as a child.  . Alcohol use No  . Drug use: No  . Sexual activity: Not Asked   Other Topics Concern  . None   Social History Narrative   [facility] (04/06/17):    Originally from [facility]. 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 No pets currently. Remote bird exposure. No mold or hot tub exposure.       Objective:   Physical Exam BP 98/72 (BP Location: Right Arm, Patient Position: Sitting, Cuff Size: Normal)   Pulse 85   Ht 5\' 6"  (1.676 m)   Wt 167 lb 12.8 oz (76.1 kg)   SpO2 98%   BMI 27.08 kg/m   General:  Awake. Alert. No distress.  Integument:  Warm & dry. No rash on exposed skin. No bruising. Extremities:  No cyanosis or clubbing.  HEENT:  Moist mucus membranes. No oral ulcers. Mild bilateral nasal turbinate swelling. Cardiovascular:  Regular rate. No edema. No appreciable JVD.  Pulmonary:  Clear bilaterally to auscultation. No accessory muscle use on room air. Abdomen: Soft. Normal bowel sounds. Nondistended.  Musculoskeletal:  Normal bulk and tone. No joint deformity or effusion appreciated.  PFT 03/19/17: FVC 2.90 L (82%) FEV1 2.15 L (78%) FEV1/FVC 0.74 FEF 25-75 1.58 L (62%) negative bronchodilator response TLC 5.34 L (101%) RV 123% ERV 66% DLCO uncorrected 80%  IMAGING CXR PA/LAT 02/20/17 (personally reviewed by me):  No pleural effusion. Mild tenting of left hemidiaphragm. No parenchymal mass or opacity appreciated. Heart normal in size & mediastinum normal in contour. This is unchanged compared with her x-ray from 02/12/17 as reviewed by me today.    Assessment & Plan:  56 y.o. female with previous dyspnea on exertion, arthralgias & myalgias, and chronic seasonal allergic rhinitis. Patient's voice changes today are quite probably secondary to reflux. Given she is drinking more acidic and caffeinated substances as well as increasing her liquid intake along with dairy products immediately prior to bedtime. Reviewing her x-ray shows no parenchymal cause for her dyspnea and with her normal pulmonary function  testing showing only very mild air trapping I suspect if anything silent laryngo-esophageal reflux is causing some of her symptoms. Certainly her chronic pain and musculoskeletal complaints are limiting factor to her exertional capabilities. I instructed the patient contact my office if she had any new breathing problems or questions before next appointment.  1. Dyspnea on exertion: Improving. Suspect this is multifactorial. Continuing to use albuterol rescue inhaler as needed. 2. Chronic seasonal allergic rhinitis: Patient continuing on intranasal corticosteroid as needed. 3. Voice compliant: Counseled on appropriate dietary and lifestyle modifications to minimize silent reflux. Recommended Pepcid daily at bedtime. 4. Health maintenance: Status post influenza vaccine February 2018. 5. Follow-up: Return to clinic  in 6 months or sooner if needed.   Donna ChristenJennings E. Jamison NeighborNestor, M.D. [facility] Pager:  [phone] After 3pm or if no response, call [phone] 10:09 AM 05/31/17

## 2017-06-01 ENCOUNTER — Telehealth: Payer: Self-pay | Admitting: *Deleted

## 2017-06-01 NOTE — Telephone Encounter (Signed)
LVM informing patient her MRI lumbar spine results are unremarkable. Left number for any questions.

## 2017-06-21 ENCOUNTER — Telehealth: Payer: Self-pay | Admitting: Diagnostic Neuroimaging

## 2017-06-21 ENCOUNTER — Encounter: Payer: Self-pay | Admitting: Diagnostic Neuroimaging

## 2017-06-21 NOTE — Telephone Encounter (Addendum)
Spoke with patient and informed her Dr Marjory LiesPenumalli does not have any recommendations at this time, but she may come see him for FU. Patient stated she "doesn't know what else to do". She staed she did have hoarsenss when she first saw Dr Marjory LiesPenumalli,  and she has acid reflux, takes Prevacid.  She stopped Gabapentin approximately two weeks ago due to changes in her mood. She stated she continues to have pain and numbness. She would like to come in for FU; scheduled her for next Tues, advised she arrive 30 min early to check in. She verbalized understanding, appreciation.

## 2017-06-21 NOTE — Telephone Encounter (Signed)
Patient called office in reference to having hoariness, sore throat and ringing in her ears.  Patient seen ENT specialist last prescribing doxycycline, allegra with no change.  Exam at ENT was clear. Patient discontinued Gabapentin due to "crazy acting", yelling about everything.

## 2017-06-21 NOTE — Telephone Encounter (Signed)
pls contact patient. No specific neurologic recs at this time. May setup follow up appt if needed with me. -VRP

## 2017-06-26 ENCOUNTER — Ambulatory Visit (INDEPENDENT_AMBULATORY_CARE_PROVIDER_SITE_OTHER): Payer: BLUE CROSS/BLUE SHIELD | Admitting: Diagnostic Neuroimaging

## 2017-06-26 ENCOUNTER — Encounter: Payer: Self-pay | Admitting: Diagnostic Neuroimaging

## 2017-06-26 VITALS — BP 112/73 | HR 89 | Ht 66.0 in | Wt 169.8 lb

## 2017-06-26 DIAGNOSIS — M79605 Pain in left leg: Secondary | ICD-10-CM

## 2017-06-26 DIAGNOSIS — M791 Myalgia, unspecified site: Secondary | ICD-10-CM

## 2017-06-26 DIAGNOSIS — R5383 Other fatigue: Secondary | ICD-10-CM

## 2017-06-26 DIAGNOSIS — M542 Cervicalgia: Secondary | ICD-10-CM

## 2017-06-26 DIAGNOSIS — M79604 Pain in right leg: Secondary | ICD-10-CM | POA: Diagnosis not present

## 2017-06-26 DIAGNOSIS — F419 Anxiety disorder, unspecified: Secondary | ICD-10-CM

## 2017-06-26 NOTE — Patient Instructions (Addendum)
-   alternate ibuprofen and tylenol for pain  - will setup physical therapy and psychology counseling  - agree with sertraline for anxiety / stress symptoms  - follow up with Dr. Renne CriglerPharr re: thyroid cysts (may need ultrasound)

## 2017-06-26 NOTE — Progress Notes (Signed)
[facility]  PATIENT: [name] DOB: 06/05/1961  REFERRING CLINICIAN:  A Ramachandran HISTORY FROM: patient  REASON FOR VISIT: follow up   HISTORICAL  CHIEF COMPLAINT:  Chief Complaint  Patient presents with  . Follow-up  . Burning leg pain    off gabapentin due to mood changes.  Increase in numbness and pain,      HISTORY OF PRESENT ILLNESS:     UPDATE 06/26/17: Since last visit doing about the same. Muscle aches, fatigue and depression continue. Test results reviewed. Patient does endorse some stress related to caring for her aging father who is somewhat demanding, and also her concern about possible underlying serious medical issue.   PRIOR HPI (04/30/17): 56 year old right-handed female here for evaluation of constellation of symptoms including sharp shooting pains in legs, fatigue, numbness in mouth. In February 2018 patient was having increasing shortness of breath. She was diagnosed with pneumonia, pleurisy, pericardial effusion. This was treated conservatively. Patient was tried on several inhalers and had some side effects which increased her symptoms. She started to have increasing sharp shooting pains in her legs, thighs, hips, left arm and hand. In spite of changing her medication symptoms continued. Now patient having significant fatigue, anxiety, nervousness. Patient has also noticed numbness and tingling in her mouth and tongue. No specific triggering or alleviating factors. Patient having increasing weight gain. Constipation ringing in ears shortness of breath feeling cold increased thirst allergies.    REVIEW OF SYSTEMS: Full 14 system review of systems performed and negative with exception of: chills confusion numbness weakness anxiety   ALLERGIES: Allergies  Allergen Reactions  . Breo Ellipta [Fluticasone Furoate-Vilanterol]   . Ciprofloxacin Hcl   . Codeine   . Gabapentin Other (See Comments)    "crazy, yelling", mood changes  .  Nitrofurantoin   . Penicillins   . Pseudoephedrine     04/30/17 can take OTC brand  . Sulfonamide Derivatives     HOME MEDICATIONS: Outpatient Medications Prior to Visit  Medication Sig Dispense Refill  . albuterol (PROVENTIL HFA;VENTOLIN HFA) 108 (90 Base) MCG/ACT inhaler Inhale into the lungs every 6 (six) hours as needed for wheezing or shortness of breath.    . calcium-vitamin D (OSCAL WITH D) 250-125 MG-UNIT tablet Take 2 tablets by mouth daily.    Marland Kitchen. estradiol (VIVELLE-DOT) 0.025 MG/24HR Place 1 patch onto the skin as directed.      Marland Kitchen. LORazepam (ATIVAN) 1 MG tablet Take 1 mg by mouth at bedtime as needed.      . Multiple Vitamins-Minerals (MULTIVITAMIN WITH MINERALS) tablet Take 1 tablet by mouth daily.    Marland Kitchen. triamcinolone (NASACORT ALLERGY 24HR) 55 MCG/ACT AERO nasal inhaler Place 2 sprays into the nose as needed.    . gabapentin (NEURONTIN) 300 MG capsule Take 1 capsule (300 mg total) by mouth at bedtime. (Patient not taking: Reported on 06/21/2017) 30 capsule 3  . sertraline (ZOLOFT) 50 MG tablet Take 50 mg by mouth daily. 04/30/17 has not started  0  . lansoprazole (PREVACID) 15 MG capsule Take 15 mg by mouth daily at 12 noon.     Facility-Administered Medications Prior to Visit  Medication Dose Route Frequency Provider Last Rate Last Dose  . gadopentetate dimeglumine (MAGNEVIST) injection 18 mL  18 mL Intravenous Once PRN Quentin Strebel, Glenford BayleyVikram R, MD        PAST MEDICAL HISTORY: Past Medical History:  Diagnosis Date  . Acid reflux   . Allergic rhinitis   . Anxiety disorder   .  Chronic constipation   . Hypoglycemia   . Internal hemorrhoids   . Pneumonia    pleural effusion  . S/P sclerotherapy of varicose veins   . Uterine leiomyoma     PAST SURGICAL HISTORY: Past Surgical History:  Procedure Laterality Date  . ENDOVENOUS ABLATION SAPHENOUS VEIN W/ LASER  2007  . KNEE CARTILAGE SURGERY Left   . TUBAL LIGATION  1988   bilateral  . VAGINAL HYSTERECTOMY  2003    FAMILY  HISTORY: Family History  Problem Relation Age of Onset  . Stomach cancer Unknown        Aunt  . Colitis Sister        ulcerative colitis  . COPD Father   . Rheum arthritis Maternal Uncle   . Myasthenia gravis Paternal Uncle   . Multiple sclerosis Maternal Grandmother   . Thyroid disease Mother   . Stroke Paternal Grandmother        deceased age 40, poss aneurysum  . Aneurysm Son        poss due to injury    SOCIAL HISTORY:  Social History   Social History  . Marital status: Married    Spouse name: Adela Lank  . Number of children: 3  . Years of education: 12   Occupational History  .      home maker   Social History Main Topics  . Smoking status: Passive Smoke Exposure - Never Smoker  . Smokeless tobacco: Never Used     Comment: as a child.  . Alcohol use No  . Drug use: No  . Sexual activity: Not on file   Other Topics Concern  . Not on file   Social History Narrative   [facility] (04/06/17):   Originally from [facility]. 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 No pets currently. Remote bird exposure. No mold or hot tub exposure.      PHYSICAL EXAM  GENERAL EXAM/CONSTITUTIONAL: Vitals:  Vitals:   06/26/17 0948  BP: 112/73  Pulse: 89  Weight: 169 lb 12.8 oz (77 kg)  Height: 5\' 6"  (1.676 m)   Body mass index is 27.41 kg/m. No exam data present  Patient is in no distress; well developed, nourished and groomed; neck is supple  CARDIOVASCULAR:  Examination of carotid arteries is normal; no carotid bruits  Regular rate and rhythm, no murmurs  Examination of peripheral vascular system by observation and palpation is normal  EYES:  Ophthalmoscopic exam of optic discs and posterior segments is normal; no papilledema or hemorrhages  MUSCULOSKELETAL:  Gait, strength, tone, movements noted in Neurologic exam  below  NEUROLOGIC: MENTAL STATUS:  No flowsheet data found.  awake, alert, oriented to person, place and time  recent and remote memory intact  normal attention and concentration  language fluent, comprehension intact, naming intact,   fund of knowledge appropriate  CRANIAL NERVE:   2nd - no papilledema on fundoscopic exam  2nd, 3rd, 4th, 6th - pupils equal and reactive to light, visual fields full to confrontation, extraocular muscles intact, no nystagmus  5th - facial sensation symmetric  7th - facial strength symmetric  8th - hearing intact  9th - palate elevates symmetrically, uvula midline  11th - shoulder shrug symmetric  12th - tongue protrusion midline  MOTOR:   normal bulk and tone, full strength in the BUE, BLE  SENSORY:  normal and symmetric to light touch, temperature, vibration  COORDINATION:   finger-nose-finger, fine finger movements normal  REFLEXES:   deep tendon reflexes present and symmetric  GAIT/STATION:   narrow based gait    DIAGNOSTIC DATA (LABS, IMAGING, TESTING) - I reviewed patient records, labs, notes, testing and imaging myself where available.  No results found for: WBC, HGB, HCT, MCV, PLT    Component Value Date/Time   NA 141 06/14/2012 1514   K 5.3 (H) 06/14/2012 1514   CL 105 06/14/2012 1514   CO2 27 06/14/2012 1514   GLUCOSE 82 06/14/2012 1514   BUN 21 06/14/2012 1514   CREATININE 0.8 06/14/2012 1514   CALCIUM 10.1 06/14/2012 1514   PROT 7.5 06/14/2012 1514   ALBUMIN 4.4 06/14/2012 1514   AST 24 06/14/2012 1514   ALT 16 06/14/2012 1514   ALKPHOS 56 06/14/2012 1514   BILITOT 0.3 06/14/2012 1514   No results found for: CHOL, HDL, LDLCALC, LDLDIRECT, TRIG, CHOLHDL Lab Results  Component Value Date   HGBA1C 5.1 04/30/2017   No results found for: [id number] Lab Results  Component Value Date   TSH 2.800 04/30/2017    04/30/17 Labs -  CK, aldolase, TSH, free T4 achr --> normal  05/03/17 EMG/NCS - This  is a normal study. No electrodiagnostic evidence of large fiber neuropathy or myopathy at this time.  05/02/17 Equivocal MRI brain (with and without) demonstrating: 1. Minimal periventricular and subcortical foci of non-specific gliosis.  2. Incidentally noted is a small extra-axial lesion, in the left lateral frontal region, likely a small meningioma (12x83mm) as described above. No significant mass effect or midline shift.  05/02/17 MRI cervical spine (with and without) demonstrating: 1. At C3-4: disc bulging with mild spinal stenosis and no foraminal narrowing; subtle T2 hyperintensity within the spinal cord at this level on sagittal views but not seen on axial views, or STIR sagittal views, which may represent artifact versus edema. 2. At C5-6: disc bulging with mild spinal stenosis and no foraminal stenosis  3. At C4-5: uncovertebral joint hypertrophy with mild right foraminal stenosis  4. Incidentally noted is a small heterogeneous lesion (8mm) in the right thyroid, possibly thyroid cyst versus multinodular goiter.  05/30/17 MRI lumbar spine 1. Minimal disc bulging at L2-3 to L4-5. 2. No spinal stenosis or foraminal stenosis.      ASSESSMENT AND PLAN  56 y.o. year old female here with combination of symptoms since Feb 2018 including diffuse myalgias, pain, numbness, tingling, fatigue.  - Leg aches / pains - Diffuse fatigue - stress, anxiety, fatigue   Dx: cervical radiculopathy, lumbar radiculopathy, myalgia, depression, anxiety state  1. Neck pain   2. Myalgia   3. Pain in both lower extremities   4. Other fatigue     PLAN:  MYALGIA, PAIN, FATIGUE - alternate ibuprofen and tylenol for pain - will setup physical therapy for pain mgmt and strength training - will setup psychology counseling for anxiety / depression - agree with sertraline for anxiety / stress symptoms (rx'd by PCP) - encouraged patient to focus on nutrition, physical activity/movement, mindfulness  activities  MENINGIOMA (incidental finding) - monitor - repeat MRI brain in 1 year  THYROID CYSTS (incidental finding; normal labs) - monitor; follow up with PCP  Orders Placed This Encounter  Procedures  . Ambulatory referral to Physical Therapy  . Ambulatory referral to Psychology   Return in about 6 months (around 12/27/2017).     Suanne Marker, MD 06/26/2017, 10:03 AM Certified in  Neurology, Neurophysiology and Neuroimaging  [facility] [address], [place], [place] [address] [phone]

## 2017-08-20 ENCOUNTER — Ambulatory Visit (INDEPENDENT_AMBULATORY_CARE_PROVIDER_SITE_OTHER): Payer: BLUE CROSS/BLUE SHIELD | Admitting: Physician Assistant

## 2017-08-20 ENCOUNTER — Encounter: Payer: Self-pay | Admitting: Physician Assistant

## 2017-08-20 VITALS — BP 94/66 | HR 88 | Ht 65.06 in | Wt 168.1 lb

## 2017-08-20 DIAGNOSIS — R05 Cough: Secondary | ICD-10-CM

## 2017-08-20 DIAGNOSIS — R059 Cough, unspecified: Secondary | ICD-10-CM

## 2017-08-20 DIAGNOSIS — K219 Gastro-esophageal reflux disease without esophagitis: Secondary | ICD-10-CM

## 2017-08-20 MED ORDER — PANTOPRAZOLE SODIUM 40 MG PO TBEC: 40.0000 mg | DELAYED_RELEASE_TABLET | Freq: Two times a day (BID) | ORAL | 5 refills | Status: DC

## 2017-08-20 NOTE — Patient Instructions (Signed)
We have sent the following medications to your pharmacy for you to pick up at your convenience: Protonix 40 mg to take twice daily before breakfast and dinner.   Restart Zyrtec daily.   Patient advised to avoid spicy, acidic, citrus, chocolate, mints, fruit and fruit juices.  Limit the intake of caffeine, alcohol and Soda.  Don't exercise too soon after eating.  Don't lie down within 3-4 hours of eating.  Elevate the head of your bed.  You have been scheduled for a Barium Esophogram at [facility] on 08/23/17 at 8:30am. Please arrive 15 minutes prior to your appointment for registration. Make certain not to have anything to eat or drink 3 hours prior to your test. If you need to reschedule for any reason, please contact radiology at [phone] to do so. __________________________________________________________________ A barium swallow is an examination that concentrates on views of the esophagus. This tends to be a double contrast exam (barium and two liquids which, when combined, create a gas to distend the wall of the oesophagus) or single contrast (non-ionic iodine based). The study is usually tailored to your symptoms so a good history is essential. Attention is paid during the study to the form, structure and configuration of the esophagus, looking for functional disorders (such as aspiration, dysphagia, achalasia, motility and reflux) EXAMINATION You may be asked to change into a gown, depending on the type of swallow being performed. A radiologist and radiographer will perform the procedure. The radiologist will advise you of the type of contrast selected for your procedure and direct you during the exam. You will be asked to stand, sit or lie in several different positions and to hold a small amount of fluid in your mouth before being asked to swallow while the imaging is performed .In some instances you may be asked to swallow barium coated marshmallows to assess the motility of a  solid food bolus. The exam can be recorded as a digital or video fluoroscopy procedure. POST PROCEDURE It will take 1-2 days for the barium to pass through your system. To facilitate this, it is important, unless otherwise directed, to increase your fluids for the next 24-48hrs and to resume your normal diet.  This test typically takes about 30 minutes to perform. _________________________________________________________________________  Follow up wih Amy Esterwood in 2-3 months.

## 2017-08-20 NOTE — Progress Notes (Signed)
Subjective:    Patient ID: [name], female    DOB: [birth year], 56 y.o.   MRN: [id number]  HPI Jamesha is a pleasant 56 year old white female, known to Dr. Russella Dar. She has been seen in the past for colonoscopy. History is pertinent for a small pericardial effusion which is being followed, osteoarthritis and seasonal allergies. Colonoscopy had been done in June 2012, this was a normal exam with the exception of internal hemorrhoids and she is indicated for 10 year interval follow-up. She has not had prior EGD. Patient has had issues with sinusitis and allergies. She was seen by ENT/Dr. Haroldine Laws several months ago who treated her for sinusitis. She had also been started on Zyrtec but is not sure this made much difference. She comes in today with concerns about acid reflux. Her primary symptoms have been hoarseness, belching and burping and a well feeling in her throat. She denies any dysphagia or odynophagia. She occasionally gets a little bit of discomfort in her chest primarily on the left. No abdominal pain. She admits that she's been having a lot of anxiety recently as well dealing with her father who is ill. Her symptoms are typically worse at night waking her up with cough area she says she does a lot of drooling at night as well. She started taking Zantac 150 mg twice daily if about 3-4 weeks ago and says she has had some improvement in her symptoms but not resolution.  Review of Systems Pertinent positive and negative review of systems were noted in the above HPI section.  All other review of systems was otherwise negative.  Outpatient Encounter Prescriptions as of 08/20/2017  Medication Sig  . albuterol (PROVENTIL HFA;VENTOLIN HFA) 108 (90 Base) MCG/ACT inhaler Inhale into the lungs every 6 (six) hours as needed for wheezing or shortness of breath.  . calcium-vitamin D (OSCAL WITH D) 250-125 MG-UNIT tablet Take 2 tablets by mouth daily.  Marland Kitchen estradiol (VIVELLE-DOT) 0.025 MG/24HR Place 1  patch onto the skin as directed.    Marland Kitchen LORazepam (ATIVAN) 1 MG tablet Take 1 mg by mouth at bedtime as needed.    . Multiple Vitamins-Minerals (MULTIVITAMIN WITH MINERALS) tablet Take 1 tablet by mouth daily.  . ranitidine (ZANTAC) 150 MG tablet Take 150 mg by mouth 2 (two) times daily.  . pantoprazole (PROTONIX) 40 MG tablet Take 1 tablet (40 mg total) by mouth 2 (two) times daily. Before and breakfast and dinner  . [DISCONTINUED] gabapentin (NEURONTIN) 300 MG capsule Take 1 capsule (300 mg total) by mouth at bedtime. (Patient not taking: Reported on 06/21/2017)  . [DISCONTINUED] sertraline (ZOLOFT) 50 MG tablet Take 50 mg by mouth daily. 04/30/17 has not started  . [DISCONTINUED] triamcinolone (NASACORT ALLERGY 24HR) 55 MCG/ACT AERO nasal inhaler Place 2 sprays into the nose as needed.   Facility-Administered Encounter Medications as of 08/20/2017  Medication  . gadopentetate dimeglumine (MAGNEVIST) injection 18 mL   Allergies  Allergen Reactions  . Breo Ellipta [Fluticasone Furoate-Vilanterol]   . Ciprofloxacin Hcl   . Codeine   . Gabapentin Other (See Comments)    "crazy, yelling", mood changes  . Nitrofurantoin   . Penicillins   . Pseudoephedrine     04/30/17 can take OTC brand  . Sulfonamide Derivatives   . Ultram [Tramadol Hcl]     hallucinations   Patient Active Problem List   Diagnosis Date Noted  . Dyspnea 04/06/2017  . Myalgia 04/06/2017  . Arthritis 04/06/2017  . Chronic seasonal allergic rhinitis 04/06/2017  .  Pericardial effusion 07/09/2012  . CONSTIPATION 11/23/2010   Social History   Social History  . Marital status: Married    Spouse name: Adela Lank  . Number of children: 3  . Years of education: 12   Occupational History  .      home maker   Social History Main Topics  . Smoking status: Passive Smoke Exposure - Never Smoker  . Smokeless tobacco: Never Used     Comment: as a child.  . Alcohol use No  . Drug use: No  . Sexual activity: Not on file   Other  Topics Concern  . Not on file   Social History Narrative   [facility] (04/06/17):   Originally from [facility] [facility] [place]. 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 No pets currently. Remote bird exposure. No mold or hot tub exposure.     Ms. Hughett family history includes Aneurysm in her son; COPD in her father; Colitis in her sister; Multiple sclerosis in her maternal grandmother; Myasthenia gravis in her paternal uncle; Rheum arthritis in her maternal uncle; Stomach cancer in her unknown relative; Stroke in her paternal grandmother; Thyroid disease in her mother.      Objective:    Vitals:   08/20/17 0921  BP: 94/66  Pulse: 88    Physical Exam  well-developed white female in no acute distress, pleasant blood pressure 94/66 pulse 88 height 5 foot 5 weight 168 BMI of 27.9. HEENT; nontraumatic, cephalic EOMI PERRLA sclera anicteric, Cardiovascular ;regular rate and rhythm with S1-S2 no murmur or gallop, Pulmonary ;clear bilaterally, Abdomen; soft, nontender nondistended bowel sounds are active there is no palpable mass or hepatosplenomegaly exam not done, Ext; no clubbing cyanosis or edema skin warm and dry, Neuropsych; mood and affect appropriate      Assessment & Plan:   #68 56 year old white female with GERD/LPR who also likely has some overlap with chronic postnasal drip and chronic allergies. #2 small pericardial effusion-chronic being followed by cardiology  #3 colon cancer surveillance up-to-date with colonoscopy normal exam 2012 #4 internal hemorrhoids  Plan; Strict antireflux regimen, we reviewed with recommendation for nothing by mouth for 3 hours prior to bedtime and elevation of the head of the bed at least 45, Start omeprazole 20 mg by mouth twice a day before meals breakfast and before meals dinner.  We'll plan a 3 month course and then if  symptoms have resolved plan to de- escalate therapy at that time.  Also asked  her to restart Zyrtec one  daily. She will follow up with them yesterday with PA-C in 2-3 months. Plan follow-up colonoscopy 2022.   Kydan Shanholtzer S Soyla Bainter PA-C 08/20/2017   Cc: Georgianne Fick, MD

## 2017-08-20 NOTE — Progress Notes (Signed)
Reviewed and agree with initial management plan.  Norris Brumbach T. Demarrion Meiklejohn, MD FACG 

## 2017-08-23 ENCOUNTER — Ambulatory Visit (HOSPITAL_COMMUNITY): Payer: BLUE CROSS/BLUE SHIELD

## 2017-08-28 ENCOUNTER — Telehealth: Payer: Self-pay | Admitting: Gastroenterology

## 2017-08-28 ENCOUNTER — Ambulatory Visit (HOSPITAL_COMMUNITY)
Admission: RE | Admit: 2017-08-28 | Discharge: 2017-08-28 | Disposition: A | Discharge status: Home / Self Care | Payer: BLUE CROSS/BLUE SHIELD | Source: Ambulatory Visit | Attending: Physician Assistant | Admitting: Physician Assistant

## 2017-08-28 DIAGNOSIS — R05 Cough: Secondary | ICD-10-CM | POA: Diagnosis present

## 2017-08-28 DIAGNOSIS — K224 Dyskinesia of esophagus: Secondary | ICD-10-CM | POA: Insufficient documentation

## 2017-08-28 DIAGNOSIS — R059 Cough, unspecified: Secondary | ICD-10-CM

## 2017-08-28 DIAGNOSIS — K219 Gastro-esophageal reflux disease without esophagitis: Secondary | ICD-10-CM | POA: Diagnosis not present

## 2017-08-28 MED ORDER — MAGNESIUM HYDROXIDE 400 MG/5ML PO SUSP
ORAL | Status: AC
  Filled 2017-08-28: qty 30

## 2017-08-28 NOTE — Telephone Encounter (Signed)
Patient called this morning. She states last night she has been having severe left arm and chest pain, worse than she has ever had in the past. She has been on protonix in recent days which is new for her - she states this has helped to control her reflux symptoms. She asked if the protonix is causing her arm / chest pain, and I doubt this is the case if it is controlling her reflux well otherwise. She reports a history of pericardial effusion. Given her new arm and chest pain, and the severity of it, recommend she go to the ER to be evaluated, ensure he heart is okay. She agreed with the plan.   Amy / Judie PetitMalcolm, Lorain ChildesFYI

## 2017-08-28 NOTE — Telephone Encounter (Signed)
Thank you :)

## 2017-11-08 ENCOUNTER — Ambulatory Visit: Payer: BLUE CROSS/BLUE SHIELD | Admitting: Physician Assistant

## 2017-11-08 ENCOUNTER — Encounter: Payer: Self-pay | Admitting: Physician Assistant

## 2017-11-08 VITALS — BP 110/72 | HR 82 | Ht 65.5 in | Wt 166.0 lb

## 2017-11-08 DIAGNOSIS — K219 Gastro-esophageal reflux disease without esophagitis: Secondary | ICD-10-CM

## 2017-11-08 MED ORDER — PANTOPRAZOLE SODIUM 40 MG PO TBEC: 40.0000 mg | DELAYED_RELEASE_TABLET | Freq: Two times a day (BID) | ORAL | 1 refills | Status: DC

## 2017-11-08 NOTE — Progress Notes (Signed)
Subjective:    Patient ID: [name], female    DOB: 08/07/1961, 56 y.o.   MRN: [id number]  HPI [name] is a very nice 56 year old white female, known to Dr. Russella DarStark who was last seen in the office by myself on 08/20/2017.  She comes in today for follow-up.  She had been seen for complaints of GERD/LPR also had some symptoms consistent with chronic postnasal drip and allergies. She was started on a strict antireflux regimen, and Protonix 40 mg  twice daily.  She was also advised to try restarting Zyrtec short-term. She comes in today stating that she feels much better.  She says she is at least 90% better and that her overall outlook and energy level has improved significantly as well since her symptoms have resolved.  She has had a few episodes of heartburn and reflux associated with drinking coffee and eating chocolate but other than that is not having any regular symptoms.  Her hoarseness has resolved.  She has not required the Zyrtec on a regular basis. She relates that she and her husband also recently moved to a condo rather than a house and significantly downsized which she is quite pleased about.  Review of Systems Pertinent positive and negative review of systems were noted in the above HPI section.  All other review of systems was otherwise negative.  Outpatient Encounter Medications as of 11/08/2017  Medication Sig  . albuterol (PROVENTIL HFA;VENTOLIN HFA) 108 (90 Base) MCG/ACT inhaler Inhale into the lungs every 6 (six) hours as needed for wheezing or shortness of breath.  . calcium-vitamin D (OSCAL WITH D) 250-125 MG-UNIT tablet Take 2 tablets by mouth daily.  Marland Kitchen. estradiol (VIVELLE-DOT) 0.025 MG/24HR Place 1 patch onto the skin as directed.    Marland Kitchen. LORazepam (ATIVAN) 1 MG tablet Take 1 mg by mouth at bedtime as needed.    . Multiple Vitamins-Minerals (MULTIVITAMIN WITH MINERALS) tablet Take 1 tablet by mouth daily.  . pantoprazole (PROTONIX) 40 MG tablet Take 1 tablet (40 mg total) 2  (two) times daily by mouth. Before and breakfast and dinner  . [DISCONTINUED] pantoprazole (PROTONIX) 40 MG tablet Take 1 tablet (40 mg total) by mouth 2 (two) times daily. Before and breakfast and dinner  . [DISCONTINUED] ranitidine (ZANTAC) 150 MG tablet Take 150 mg by mouth 2 (two) times daily.   Facility-Administered Encounter Medications as of 11/08/2017  Medication  . gadopentetate dimeglumine (MAGNEVIST) injection 18 mL   Allergies  Allergen Reactions  . Breo Ellipta [Fluticasone Furoate-Vilanterol]   . Ciprofloxacin Hcl   . Codeine   . Gabapentin Other (See Comments)    "crazy, yelling", mood changes  . Nitrofurantoin   . Penicillins   . Pseudoephedrine     04/30/17 can take OTC brand  . Sulfonamide Derivatives   . Ultram [Tramadol Hcl]     hallucinations   Patient Active Problem List   Diagnosis Date Noted  . Dyspnea 04/06/2017  . Myalgia 04/06/2017  . Arthritis 04/06/2017  . Chronic seasonal allergic rhinitis 04/06/2017  . Pericardial effusion 07/09/2012  . CONSTIPATION 11/23/2010   Social History   Socioeconomic History  . Marital status: Married    Spouse name: Adela LankFloyd  . Number of children: 3  . Years of education: 4612  . Highest education level: Not on file  Social Needs  . Financial resource strain: Not on file  . Food insecurity - worry: Not on file  . Food insecurity - inability: Not on file  . Transportation needs -  medical: Not on file  . Transportation needs - non-medical: Not on file  Occupational History  . 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 Smoking status: Passive Smoke Exposure - Never Smoker  . Smokeless tobacco: Never Used  . Tobacco comment: as a child.  Substance and Sexual Activity  . Alcohol use: No  . Drug use: No  . Sexual activity: Not on file  Other Topics Concern  . Not on file  Social History Narrative   [facility] (04/06/17):   [place] 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 She has been caring for her father mostly the last couple of years. No pets currently. Remote bird exposure. No mold or hot tub exposure.     Ms. Gerome Samoah's family history includes Aneurysm in her son; COPD in her father; Colitis in her sister; Multiple sclerosis in her maternal grandmother; Myasthenia gravis in her paternal uncle; Rheum arthritis in her maternal uncle; Stomach cancer in her unknown relative; Stroke in her paternal grandmother; Thyroid disease in her mother.      Objective:    Vitals:   11/08/17 0912  BP: 110/72  Pulse: 82    Physical Exam well-developed white female in no acute distress, pleasant blood pressure 110/72, pulse 82, height 5 foot 5, weight 166, BMI 27.2.  Not further examined today discussion only       Assessment & Plan:   #621 56 year old white female with chronic GERD and LPR, much improved on 2 times daily pantoprazole 20 mg. #2 colon cancer surveillance-normal colonoscopy June 2012 with exception of internal hemorrhoids due for follow-up 2022  Plan; patient will continue antireflux regimen. She will continue twice daily Protonix 40 mg through the end of the year and then decrease to Protonix 40 mg 1 p.o. every morning before meals breakfast. She will follow-up with Dr. Russella DarStark or myself on an as-needed basis. Greater than 50% of visit spent in counseling and education.  Amy Oswald HillockS Esterwood PA-C 11/08/2017   Cc: Merri BrunettePharr, Walter, MD

## 2017-11-08 NOTE — Patient Instructions (Signed)
Continue the Anti-reflux regimin.. Continue Protonix 40 mg twice daily until Jan 1st then take 1 tab once daily.

## 2017-11-09 NOTE — Progress Notes (Signed)
Reviewed and agree with management plan.  Zahli Vetsch T. Demetrios Byron, MD FACG 

## 2017-11-23 ENCOUNTER — Ambulatory Visit: Payer: BLUE CROSS/BLUE SHIELD | Admitting: Pulmonary Disease

## 2018-01-02 ENCOUNTER — Ambulatory Visit: Payer: BLUE CROSS/BLUE SHIELD | Admitting: Diagnostic Neuroimaging

## 2018-03-12 ENCOUNTER — Ambulatory Visit: Payer: BLUE CROSS/BLUE SHIELD | Admitting: Diagnostic Neuroimaging

## 2018-06-30 ENCOUNTER — Other Ambulatory Visit: Payer: Self-pay | Admitting: Physician Assistant

## 2018-07-24 ENCOUNTER — Other Ambulatory Visit: Payer: Self-pay | Admitting: Physician Assistant

## 2018-09-19 ENCOUNTER — Encounter: Payer: Self-pay | Admitting: Internal Medicine

## 2018-10-08 ENCOUNTER — Ambulatory Visit: Payer: BLUE CROSS/BLUE SHIELD | Admitting: Internal Medicine

## 2018-10-30 ENCOUNTER — Other Ambulatory Visit: Payer: Self-pay | Admitting: Physician Assistant

## 2020-08-09 ENCOUNTER — Other Ambulatory Visit: Payer: Self-pay | Admitting: Physician Assistant

## 2020-08-09 ENCOUNTER — Other Ambulatory Visit (HOSPITAL_COMMUNITY): Payer: Self-pay | Admitting: Physician Assistant

## 2020-08-09 DIAGNOSIS — E041 Nontoxic single thyroid nodule: Secondary | ICD-10-CM

## 2020-08-16 ENCOUNTER — Ambulatory Visit (HOSPITAL_COMMUNITY)
Admission: RE | Admit: 2020-08-16 | Discharge: 2020-08-16 | Disposition: A | Discharge status: Home / Self Care | Payer: BLUE CROSS/BLUE SHIELD | Source: Ambulatory Visit | Attending: Physician Assistant | Admitting: Physician Assistant

## 2020-08-16 ENCOUNTER — Other Ambulatory Visit: Payer: Self-pay

## 2020-08-16 DIAGNOSIS — E041 Nontoxic single thyroid nodule: Secondary | ICD-10-CM | POA: Diagnosis present

## 2021-04-19 ENCOUNTER — Encounter: Payer: Self-pay | Admitting: Gastroenterology

## 2021-08-24 ENCOUNTER — Encounter: Payer: Self-pay | Admitting: Gastroenterology

## 2021-09-15 ENCOUNTER — Other Ambulatory Visit: Payer: Self-pay | Admitting: Physician Assistant

## 2021-09-15 DIAGNOSIS — E041 Nontoxic single thyroid nodule: Secondary | ICD-10-CM

## 2024-07-09 ENCOUNTER — Other Ambulatory Visit: Payer: Self-pay | Admitting: Physician Assistant

## 2024-07-09 DIAGNOSIS — E042 Nontoxic multinodular goiter: Secondary | ICD-10-CM
